# Patient Record
Sex: Male | Born: 1990 | Race: White | Hispanic: No | Marital: Single
Health system: Southern US, Community
[De-identification: ages and names within clinical notes are randomized; demographics above are authoritative.]

---

## 2019-04-22 ENCOUNTER — Inpatient Hospital Stay (HOSPITAL_COMMUNITY): Payer: Medicare Other

## 2019-04-22 ENCOUNTER — Inpatient Hospital Stay (HOSPITAL_COMMUNITY)
Admission: AD | Admit: 2019-04-22 | Discharge: 2019-04-30 | DRG: 871 | Disposition: A | Discharge status: Home / Self Care | Payer: Medicare Other | Source: Other Acute Inpatient Hospital | Attending: Internal Medicine | Admitting: Internal Medicine

## 2019-04-22 DIAGNOSIS — A419 Sepsis, unspecified organism: Secondary | ICD-10-CM

## 2019-04-22 DIAGNOSIS — J690 Pneumonitis due to inhalation of food and vomit: Secondary | ICD-10-CM | POA: Diagnosis present

## 2019-04-22 DIAGNOSIS — J1282 Pneumonia due to coronavirus disease 2019: Secondary | ICD-10-CM | POA: Diagnosis present

## 2019-04-22 DIAGNOSIS — R748 Abnormal levels of other serum enzymes: Secondary | ICD-10-CM | POA: Diagnosis present

## 2019-04-22 DIAGNOSIS — D696 Thrombocytopenia, unspecified: Secondary | ICD-10-CM | POA: Diagnosis present

## 2019-04-22 DIAGNOSIS — R6250 Unspecified lack of expected normal physiological development in childhood: Secondary | ICD-10-CM | POA: Diagnosis present

## 2019-04-22 DIAGNOSIS — G800 Spastic quadriplegic cerebral palsy: Secondary | ICD-10-CM | POA: Diagnosis present

## 2019-04-22 DIAGNOSIS — U071 COVID-19: Secondary | ICD-10-CM | POA: Diagnosis present

## 2019-04-22 DIAGNOSIS — E872 Acidosis: Secondary | ICD-10-CM | POA: Diagnosis present

## 2019-04-22 DIAGNOSIS — A4189 Other specified sepsis: Principal | ICD-10-CM | POA: Diagnosis present

## 2019-04-22 DIAGNOSIS — K7200 Acute and subacute hepatic failure without coma: Secondary | ICD-10-CM | POA: Diagnosis present

## 2019-04-22 DIAGNOSIS — G919 Hydrocephalus, unspecified: Secondary | ICD-10-CM | POA: Diagnosis present

## 2019-04-22 DIAGNOSIS — Q046 Congenital cerebral cysts: Secondary | ICD-10-CM | POA: Diagnosis not present

## 2019-04-22 DIAGNOSIS — R6521 Severe sepsis with septic shock: Secondary | ICD-10-CM | POA: Diagnosis present

## 2019-04-22 DIAGNOSIS — J9601 Acute respiratory failure with hypoxia: Secondary | ICD-10-CM | POA: Diagnosis present

## 2019-04-22 DIAGNOSIS — D649 Anemia, unspecified: Secondary | ICD-10-CM | POA: Diagnosis present

## 2019-04-22 DIAGNOSIS — H547 Unspecified visual loss: Secondary | ICD-10-CM | POA: Diagnosis present

## 2019-04-22 DIAGNOSIS — E230 Hypopituitarism: Secondary | ICD-10-CM | POA: Diagnosis present

## 2019-04-22 DIAGNOSIS — Z982 Presence of cerebrospinal fluid drainage device: Secondary | ICD-10-CM

## 2019-04-22 DIAGNOSIS — Z79899 Other long term (current) drug therapy: Secondary | ICD-10-CM

## 2019-04-22 DIAGNOSIS — E861 Hypovolemia: Secondary | ICD-10-CM | POA: Diagnosis present

## 2019-04-22 DIAGNOSIS — G40909 Epilepsy, unspecified, not intractable, without status epilepticus: Secondary | ICD-10-CM | POA: Diagnosis present

## 2019-04-22 DIAGNOSIS — E871 Hypo-osmolality and hyponatremia: Secondary | ICD-10-CM | POA: Diagnosis not present

## 2019-04-22 DIAGNOSIS — J9690 Respiratory failure, unspecified, unspecified whether with hypoxia or hypercapnia: Secondary | ICD-10-CM

## 2019-04-22 LAB — POCT I-STAT 7, (LYTES, BLD GAS, ICA,H+H)
Acid-base deficit: 12 mmol/L — ABNORMAL HIGH (ref 0.0–2.0)
Bicarbonate: 13.1 mmol/L — ABNORMAL LOW (ref 20.0–28.0)
Calcium, Ion: 1.11 mmol/L — ABNORMAL LOW (ref 1.15–1.40)
HCT: 34 % — ABNORMAL LOW (ref 39.0–52.0)
Hemoglobin: 11.6 g/dL — ABNORMAL LOW (ref 13.0–17.0)
O2 Saturation: 81 %
Patient temperature: 99.9
Potassium: 3.8 mmol/L (ref 3.5–5.1)
Sodium: 144 mmol/L (ref 135–145)
TCO2: 14 mmol/L — ABNORMAL LOW (ref 22–32)
pCO2 arterial: 27.1 mmHg — ABNORMAL LOW (ref 32.0–48.0)
pH, Arterial: 7.295 — ABNORMAL LOW (ref 7.350–7.450)
pO2, Arterial: 51 mmHg — ABNORMAL LOW (ref 83.0–108.0)

## 2019-04-22 LAB — GLUCOSE, CAPILLARY
Glucose-Capillary: 125 mg/dL — ABNORMAL HIGH (ref 70–99)
Glucose-Capillary: 111 mg/dL — ABNORMAL HIGH (ref 70–99)
Glucose-Capillary: 128 mg/dL — ABNORMAL HIGH (ref 70–99)

## 2019-04-22 LAB — COMPREHENSIVE METABOLIC PANEL
ALT: 246 U/L — ABNORMAL HIGH (ref 0–44)
AST: 379 U/L — ABNORMAL HIGH (ref 15–41)
Albumin: 3.2 g/dL — ABNORMAL LOW (ref 3.5–5.0)
Alkaline Phosphatase: 139 U/L — ABNORMAL HIGH (ref 38–126)
Anion gap: 16 — ABNORMAL HIGH (ref 5–15)
BUN: 14 mg/dL (ref 6–20)
CO2: 13 mmol/L — ABNORMAL LOW (ref 22–32)
Calcium: 8.1 mg/dL — ABNORMAL LOW (ref 8.9–10.3)
Chloride: 111 mmol/L (ref 98–111)
Creatinine, Ser: 0.97 mg/dL (ref 0.61–1.24)
GFR calc Af Amer: >60 mL/min (ref >60)
GFR calc non Af Amer: >60 mL/min (ref >60)
Glucose, Bld: 126 mg/dL — ABNORMAL HIGH (ref 70–99)
Potassium: 3.4 mmol/L — ABNORMAL LOW (ref 3.5–5.1)
Sodium: 140 mmol/L (ref 135–145)
Total Bilirubin: 1.3 mg/dL — ABNORMAL HIGH (ref 0.3–1.2)
Total Protein: 6.4 g/dL — ABNORMAL LOW (ref 6.5–8.1)

## 2019-04-22 LAB — D-DIMER, QUANTITATIVE: D-Dimer, Quant: >20 ug/mL-FEU — ABNORMAL HIGH (ref 0.00–0.50)

## 2019-04-22 LAB — MAGNESIUM: Magnesium: 1.3 mg/dL — ABNORMAL LOW (ref 1.7–2.4)

## 2019-04-22 LAB — PHOSPHORUS: Phosphorus: 3.3 mg/dL (ref 2.5–4.6)

## 2019-04-22 LAB — PROCALCITONIN: Procalcitonin: >150 ng/mL

## 2019-04-22 LAB — C-REACTIVE PROTEIN: CRP: 20.5 mg/dL — ABNORMAL HIGH (ref <1.0)

## 2019-04-22 MED ORDER — LACTATED RINGERS IV SOLN: INTRAVENOUS | Status: DC

## 2019-04-22 MED ORDER — SODIUM CHLORIDE 0.9 % IV SOLN: 100.0000 mg | Freq: Every day | INTRAVENOUS | Status: DC

## 2019-04-22 MED ORDER — SODIUM CHLORIDE 0.9 % IV SOLN
200.0000 mg | Freq: Once | INTRAVENOUS | Status: DC
  Filled 2019-04-22: qty 40

## 2019-04-22 MED ORDER — LACTATED RINGERS IV BOLUS
500.0000 mL | Freq: Once | INTRAVENOUS | Status: AC
  Administered 2019-04-22: 500 mL via INTRAVENOUS

## 2019-04-22 MED ORDER — VASOPRESSIN 20 UNIT/ML IV SOLN
0.0300 [IU]/min | INTRAVENOUS | Status: DC
  Administered 2019-04-22 – 2019-04-23 (×2): 0.03 [IU]/min via INTRAVENOUS
  Filled 2019-04-22 (×2): qty 2

## 2019-04-22 MED ORDER — SODIUM CHLORIDE 0.9 % IV SOLN
100.0000 mg | Freq: Every day | INTRAVENOUS | Status: AC
  Administered 2019-04-23 – 2019-04-26 (×4): 100 mg via INTRAVENOUS
  Filled 2019-04-22 (×4): qty 20

## 2019-04-22 MED ORDER — ENOXAPARIN SODIUM 40 MG/0.4ML ~~LOC~~ SOLN
40.0000 mg | SUBCUTANEOUS | Status: DC
  Administered 2019-04-22: 40 mg via SUBCUTANEOUS
  Filled 2019-04-22: qty 0.4

## 2019-04-22 MED ORDER — INSULIN ASPART 100 UNIT/ML ~~LOC~~ SOLN
0.0000 [IU] | SUBCUTANEOUS | Status: DC
  Administered 2019-04-23: 20:00:00 2 [IU] via SUBCUTANEOUS
  Administered 2019-04-24: 08:00:00 1 [IU] via SUBCUTANEOUS
  Administered 2019-04-24 (×2): 2 [IU] via SUBCUTANEOUS
  Administered 2019-04-25 – 2019-04-29 (×5): 1 [IU] via SUBCUTANEOUS

## 2019-04-22 MED ORDER — CHLORHEXIDINE GLUCONATE 0.12 % MT SOLN
15.0000 mL | Freq: Two times a day (BID) | OROMUCOSAL | Status: DC
  Administered 2019-04-22 – 2019-04-30 (×16): 15 mL via OROMUCOSAL
  Filled 2019-04-22 (×13): qty 15

## 2019-04-22 MED ORDER — LEVETIRACETAM IN NACL 500 MG/100ML IV SOLN
500.0000 mg | Freq: Two times a day (BID) | INTRAVENOUS | Status: DC
  Administered 2019-04-22 – 2019-04-23 (×2): 500 mg via INTRAVENOUS
  Filled 2019-04-22 (×3): qty 100

## 2019-04-22 MED ORDER — NOREPINEPHRINE 4 MG/250ML-% IV SOLN
0.0000 ug/min | INTRAVENOUS | Status: DC
  Filled 2019-04-22: qty 250

## 2019-04-22 MED ORDER — PIPERACILLIN-TAZOBACTAM 3.375 G IVPB
3.3750 g | Freq: Three times a day (TID) | INTRAVENOUS | Status: AC
  Administered 2019-04-22 – 2019-04-29 (×21): 3.375 g via INTRAVENOUS
  Filled 2019-04-22 (×22): qty 50

## 2019-04-22 MED ORDER — ORAL CARE MOUTH RINSE
15.0000 mL | Freq: Two times a day (BID) | OROMUCOSAL | Status: DC
  Administered 2019-04-22 – 2019-04-30 (×15): 15 mL via OROMUCOSAL

## 2019-04-22 MED ORDER — CHLORHEXIDINE GLUCONATE CLOTH 2 % EX PADS
6.0000 | MEDICATED_PAD | Freq: Every day | CUTANEOUS | Status: DC
  Administered 2019-04-23 – 2019-04-30 (×8): 6 via TOPICAL

## 2019-04-22 MED ORDER — DEXAMETHASONE SODIUM PHOSPHATE 10 MG/ML IJ SOLN
6.0000 mg | INTRAMUSCULAR | Status: DC
  Administered 2019-04-22 – 2019-04-25 (×4): 6 mg via INTRAVENOUS
  Filled 2019-04-22 (×4): qty 1

## 2019-04-22 MED ORDER — SODIUM CHLORIDE 0.9 % IV SOLN: INTRAVENOUS | Status: DC | PRN

## 2019-04-22 MED ORDER — NOREPINEPHRINE 16 MG/250ML-% IV SOLN
0.0000 ug/min | INTRAVENOUS | Status: DC
  Administered 2019-04-22 (×2): 70 ug/min via INTRAVENOUS
  Administered 2019-04-23: 12:00:00 45 ug/min via INTRAVENOUS
  Administered 2019-04-23: 18:00:00 47.5 ug/min via INTRAVENOUS
  Administered 2019-04-23 (×2): 70 ug/min via INTRAVENOUS
  Administered 2019-04-24: 10 ug/min via INTRAVENOUS
  Administered 2019-04-24: 01:00:00 30 ug/min via INTRAVENOUS
  Filled 2019-04-22 (×8): qty 250

## 2019-04-22 MED ORDER — ACETAMINOPHEN 650 MG RE SUPP
650.0000 mg | RECTAL | Status: DC | PRN
  Administered 2019-04-22 – 2019-04-25 (×2): 650 mg via RECTAL
  Filled 2019-04-22 (×2): qty 1

## 2019-04-22 NOTE — Progress Notes (Signed)
Received pt from [facility] via [place] on NRB. Placed pt on HFNC at 15L, then added NRB after approximately 15 minutes. Left Radial a-line placed 7.29/27/51/13. Pt placed on Heated HFNC at 40L/100%. Pt with nasal flaring and increased WOB upon admission but has gotten somewhat better with H HFNC and NRB. MD aware.

## 2019-04-22 NOTE — Procedures (Signed)
Arterial Catheter Insertion Procedure Note Owin Vignola [id number] 09-21-1990  Procedure: Insertion of Arterial Catheter  Indications: Blood pressure monitoring and Frequent blood sampling  Procedure Details Consent: Risks of procedure as well as the alternatives and risks of each were explained to the (patient/caregiver).  Consent for procedure obtained. Time Out: Verified patient identification, verified procedure, site/side was marked, verified correct patient position, special equipment/implants available, medications/allergies/relevent history reviewed, required imaging and test results available.  Performed  Maximum sterile technique was used including cap, gloves, gown, hand hygiene and mask. Skin prep: Chlorhexidine; local anesthetic administered 20 gauge catheter was inserted into left radial artery using the Seldinger technique. ULTRASOUND GUIDANCE USED: NO Evaluation Blood flow good; BP tracing good. Complications: No apparent complications.   Carolan Shiver 04/22/2019

## 2019-04-22 NOTE — Plan of Care (Signed)

## 2019-04-22 NOTE — Plan of Care (Signed)
Patient remains on a lot of oxygen 40L @100 % via HFNC. Tolerating well.  Tachycardic in the 130's and tachypnic in the 30's - 50's.  BP maintaining with Levophed and Vasopressin.  If Levophed line occludes even for a couple of seconds, BP immediately drops to 70's, but rebounds once levo restarted.  Pt has been moving about in bed, moving all extremities.  Does not appear to be in any distress. Nursing remains at bedside.  Will continue to monitor.   Problem: Education: Goal: Knowledge of General Education information will improve Description: Including pain rating scale, medication(s)/side effects and non-pharmacologic comfort measures Outcome: Progressing   Problem: Health Behavior/Discharge Planning: Goal: Ability to manage health-related needs will improve Outcome: Progressing   Problem: Clinical Measurements: Goal: Will remain free from infection Outcome: Progressing Goal: Diagnostic test results will improve Outcome: Progressing   Problem: Activity: Goal: Risk for activity intolerance will decrease Outcome: Progressing   Problem: Nutrition: Goal: Adequate nutrition will be maintained Outcome: Progressing   Problem: Coping: Goal: Level of anxiety will decrease Outcome: Progressing   Problem: Elimination: Goal: Will not experience complications related to bowel motility Outcome: Progressing Goal: Will not experience complications related to urinary retention Outcome: Progressing   Problem: Pain Managment: Goal: General experience of comfort will improve Outcome: Progressing   Problem: Safety: Goal: Ability to remain free from injury will improve Outcome: Progressing   Problem: Skin Integrity: Goal: Risk for impaired skin integrity will decrease Outcome: Progressing   Problem: Clinical Measurements: Goal: Ability to maintain clinical measurements within normal limits will improve Outcome: Not Progressing Goal: Respiratory complications will improve Outcome: Not  Progressing Goal: Cardiovascular complication will be avoided Outcome: Not Progressing

## 2019-04-22 NOTE — H&P (Signed)
NAME:  Craig Foster, MRN:  [id number], DOB:  1990/04/06, LOS: 0 ADMISSION DATE:  04/22/2019, CONSULTATION DATE:  04/22/2019 REFERRING MD:  Dr. Elvera Lennox Triad, CHIEF COMPLAINT:  Short of breath   Brief History   29 yo with hx of CP, seizure disorder, developmental delay brought to [facility] ER with dyspnea, fever.  Tested positive for COVID 19.  Had seizure, vomiting, and then aspiration.  Transferred to [facility] for further management.  History of present illness   Spoke with pt's father over video conference.  He needs 24 hour care.  He needs assistance with eating.  He can sometimes say a few words.  Most of communication if from his father's understanding of his body language.  Past Medical History  Blind, cerebral palsy, seizure disorder, developmental delay, hypopituitarism  Significant Hospital Events   1/20 transfer from [facility]:    Procedures:    Significant Diagnostic Tests:    Micro Data:  SARS CoV2 1/20 [name] >> positive  COVID Therapy:  Decadron 1/20 >> Remdesivir 1/20 >>  Antimicrobials:  Zosyn 1/20 >>    Interim history/subjective:    Objective   There were no vitals taken for this visit.       No intake or output data in the 24 hours ending 04/22/19 1734 There were no vitals filed for this visit.  Examination:  General - non verbal, using some accessory muscles Eyes - pupils dilated and unequal ENT - no sinus tenderness, no stridor Cardiac - regular, tachycardic Chest - b/l crackles Abdomen - soft, non tender, + bowel sounds Extremities - contracted Skin - no rashes Neuro - able to move Rt arm, Lt arm weak   Resolved Hospital Problem list     Assessment & Plan:   Acute hypoxic respiratory failure from COVID 19 pneumonia and aspiration pneumonia. - oxygen to keep SpO2 85 to 95% - start decadron, remdesivir - zosyn per pharmacy - f/u CXR, ABG - d/w pt's father >> agreeable to intubation if needed  Septic shock from aspiration  pneumonia and COVID 19 pneumonia. - pressors to keep MAP > 65 - continue IV fluids  Seizure disorder, cerebral palsy, blindness. - keppra 500 mg bid - prn ativan - communication will be difficult  Metabolic acidosis with lactic acidosis. - f/u BMET and then decide if he needs HCO3 in IV fluid  Hx of hypopituitarism. - decadron in place of cortef for now - add synthroid once pharmacy has reviewed his outpt medications  Vomiting with Gastric distention. - have NG tube placed to suction - f/u abd xray  Best practice:  Diet: NPO DVT prophylaxis: lovenox GI prophylaxis: Not indicated Mobility: Bed rest Code Status: full code Family Communication: spoke with father by video conference Disposition: ICU  Labs   CBC: Recent Labs  Lab 04/22/19 1719  HGB 11.6*  HCT 34.0*    Basic Metabolic Panel: Recent Labs  Lab 04/22/19 1719  NA 144  K 3.8   GFR: CrCl cannot be calculated (No successful lab value found.). No results for input(s): PROCALCITON, WBC, LATICACIDVEN in the last 168 hours.  Liver Function Tests: No results for input(s): AST, ALT, ALKPHOS, BILITOT, PROT, ALBUMIN in the last 168 hours. No results for input(s): LIPASE, AMYLASE in the last 168 hours. No results for input(s): AMMONIA in the last 168 hours.  ABG    Component Value Date/Time   PHART 7.295 (L) 04/22/2019 1719   PCO2ART 27.1 (L) 04/22/2019 1719   PO2ART 51.0 (L) 04/22/2019 1719  HCO3 13.1 (L) 04/22/2019 1719   TCO2 14 (L) 04/22/2019 1719   ACIDBASEDEF 12.0 (H) 04/22/2019 1719   O2SAT 81.0 04/22/2019 1719     Coagulation Profile: No results for input(s): INR, PROTIME in the last 168 hours.  Cardiac Enzymes: No results for input(s): CKTOTAL, CKMB, CKMBINDEX, TROPONINI in the last 168 hours.  HbA1C: No results found for: HGBA1C  CBG: No results for input(s): GLUCAP in the last 168 hours.  Review of Systems:   Unable to obtain.  Surgical History   Unable to obtain.  Social  History   Lives with his father  Family History   Unable to obtain.  Allergies No Known Allergies   Home Medications  Prior to Admission medications   Not on File     Critical care time: 39 minutes    Chesley Mires, MD [place] 04/22/2019, 5:46 PM

## 2019-04-22 NOTE — Progress Notes (Signed)
Pharmacy Antibiotic Note/Lovenox  Craig Foster is a 29 y.o. male with a h/o CP and seizure disorder admitted on 04/22/2019 with acute respiratory failure from COVID-19 PNA and aspiraton PNA.  Pharmacy has been consulted for Zosyn dosing.  SCr 1.4 (RH) Ht/Wt from [facility] 60" 57.6 kg No d-dimer  Plan: -Zosyn 4.5 g given this AM at [facility] -Zosyn 3.375 g EI q 8 h -F/U renal function, culture results  -Lovenox 40 mg daily -F/U renal function, d-dimer     No data recorded.  No results for input(s): WBC, CREATININE, LATICACIDVEN, VANCOTROUGH, VANCOPEAK, VANCORANDOM, GENTTROUGH, GENTPEAK, GENTRANDOM, TOBRATROUGH, TOBRAPEAK, TOBRARND, AMIKACINPEAK, AMIKACINTROU, AMIKACIN in the last 168 hours.  CrCl cannot be calculated (No successful lab value found.).    No Known Allergies  Antimicrobials this admission: 1/20 Zosyn >>   Microbiology results: MRSA PCR:   Thank you for allowing pharmacy to be a part of this patient's care.  Valentina Gu 04/22/2019 6:27 PM

## 2019-04-22 NOTE — Progress Notes (Signed)
Assisted tele visit to patient with father and provider.  Vena Austria, RN

## 2019-04-23 ENCOUNTER — Inpatient Hospital Stay (HOSPITAL_COMMUNITY): Payer: Medicare Other

## 2019-04-23 LAB — CBC WITH DIFFERENTIAL/PLATELET
Abs Immature Granulocytes: 1.6 10*3/uL — ABNORMAL HIGH (ref 0.00–0.07)
Band Neutrophils: 43 %
Basophils Absolute: 0 10*3/uL (ref 0.0–0.1)
Basophils Relative: 0 %
Eosinophils Absolute: 0 10*3/uL (ref 0.0–0.5)
Eosinophils Relative: 0 %
HCT: 28.5 % — ABNORMAL LOW (ref 39.0–52.0)
Hemoglobin: 9.9 g/dL — ABNORMAL LOW (ref 13.0–17.0)
Lymphocytes Relative: 4 %
Lymphs Abs: 1.3 10*3/uL (ref 0.7–4.0)
MCH: 29.9 pg (ref 26.0–34.0)
MCHC: 34.7 g/dL (ref 30.0–36.0)
MCV: 86.1 fL (ref 80.0–100.0)
Metamyelocytes Relative: 2 %
Monocytes Absolute: 1 10*3/uL (ref 0.1–1.0)
Monocytes Relative: 3 %
Myelocytes: 3 %
Neutro Abs: 28 10*3/uL — ABNORMAL HIGH (ref 1.7–7.7)
Neutrophils Relative %: 45 %
Platelets: 212 10*3/uL (ref 150–400)
RBC: 3.31 MIL/uL — ABNORMAL LOW (ref 4.22–5.81)
RDW: 12.3 % (ref 11.5–15.5)
WBC Morphology: INCREASED
WBC: 31.8 10*3/uL — ABNORMAL HIGH (ref 4.0–10.5)
nRBC: 0 % (ref 0.0–0.2)

## 2019-04-23 LAB — BASIC METABOLIC PANEL
Anion gap: 10 (ref 5–15)
BUN: 9 mg/dL (ref 6–20)
CO2: 16 mmol/L — ABNORMAL LOW (ref 22–32)
Calcium: 7.5 mg/dL — ABNORMAL LOW (ref 8.9–10.3)
Chloride: 105 mmol/L (ref 98–111)
Creatinine, Ser: 0.57 mg/dL — ABNORMAL LOW (ref 0.61–1.24)
GFR calc Af Amer: >60 mL/min (ref >60)
GFR calc non Af Amer: >60 mL/min (ref >60)
Glucose, Bld: 130 mg/dL — ABNORMAL HIGH (ref 70–99)
Potassium: 4.6 mmol/L (ref 3.5–5.1)
Sodium: 131 mmol/L — ABNORMAL LOW (ref 135–145)

## 2019-04-23 LAB — MRSA PCR SCREENING: MRSA by PCR: POSITIVE — AB

## 2019-04-23 LAB — COMPREHENSIVE METABOLIC PANEL
ALT: 215 U/L — ABNORMAL HIGH (ref 0–44)
AST: 239 U/L — ABNORMAL HIGH (ref 15–41)
Albumin: 3 g/dL — ABNORMAL LOW (ref 3.5–5.0)
Alkaline Phosphatase: 116 U/L (ref 38–126)
Anion gap: 16 — ABNORMAL HIGH (ref 5–15)
BUN: 12 mg/dL (ref 6–20)
CO2: 15 mmol/L — ABNORMAL LOW (ref 22–32)
Calcium: 7.8 mg/dL — ABNORMAL LOW (ref 8.9–10.3)
Chloride: 109 mmol/L (ref 98–111)
Creatinine, Ser: 0.79 mg/dL (ref 0.61–1.24)
GFR calc Af Amer: >60 mL/min (ref >60)
GFR calc non Af Amer: >60 mL/min (ref >60)
Glucose, Bld: 95 mg/dL (ref 70–99)
Potassium: 3.7 mmol/L (ref 3.5–5.1)
Sodium: 140 mmol/L (ref 135–145)
Total Bilirubin: 0.9 mg/dL (ref 0.3–1.2)
Total Protein: 5.9 g/dL — ABNORMAL LOW (ref 6.5–8.1)

## 2019-04-23 LAB — GLUCOSE, CAPILLARY
Glucose-Capillary: 96 mg/dL (ref 70–99)
Glucose-Capillary: 65 mg/dL — ABNORMAL LOW (ref 70–99)
Glucose-Capillary: 141 mg/dL — ABNORMAL HIGH (ref 70–99)
Glucose-Capillary: 76 mg/dL (ref 70–99)
Glucose-Capillary: 114 mg/dL — ABNORMAL HIGH (ref 70–99)
Glucose-Capillary: 173 mg/dL — ABNORMAL HIGH (ref 70–99)

## 2019-04-23 LAB — PHOSPHORUS: Phosphorus: 2.5 mg/dL (ref 2.5–4.6)

## 2019-04-23 LAB — HEMOGLOBIN A1C
Hgb A1c MFr Bld: 5.3 % (ref 4.8–5.6)
Mean Plasma Glucose: 105.41 mg/dL

## 2019-04-23 LAB — C-REACTIVE PROTEIN: CRP: 22.1 mg/dL — ABNORMAL HIGH (ref <1.0)

## 2019-04-23 LAB — LACTIC ACID, PLASMA
Lactic Acid, Venous: 5.3 mmol/L (ref 0.5–1.9)
Lactic Acid, Venous: 1.7 mmol/L (ref 0.5–1.9)

## 2019-04-23 LAB — MAGNESIUM: Magnesium: 1.3 mg/dL — ABNORMAL LOW (ref 1.7–2.4)

## 2019-04-23 LAB — HIV ANTIBODY (ROUTINE TESTING W REFLEX): HIV Screen 4th Generation wRfx: NONREACTIVE

## 2019-04-23 LAB — D-DIMER, QUANTITATIVE: D-Dimer, Quant: >20 ug/mL-FEU — ABNORMAL HIGH (ref 0.00–0.50)

## 2019-04-23 MED ORDER — ENOXAPARIN SODIUM 40 MG/0.4ML ~~LOC~~ SOLN
40.0000 mg | Freq: Two times a day (BID) | SUBCUTANEOUS | Status: DC
  Administered 2019-04-23 – 2019-04-26 (×7): 40 mg via SUBCUTANEOUS
  Filled 2019-04-23 (×7): qty 0.4

## 2019-04-23 MED ORDER — MAGNESIUM SULFATE 4 GM/100ML IV SOLN
4.0000 g | Freq: Once | INTRAVENOUS | Status: AC
  Administered 2019-04-23: 4 g via INTRAVENOUS
  Filled 2019-04-23: qty 100

## 2019-04-23 MED ORDER — VANCOMYCIN HCL 1250 MG/250ML IV SOLN
1250.0000 mg | Freq: Once | INTRAVENOUS | Status: AC
  Administered 2019-04-23: 09:00:00 1250 mg via INTRAVENOUS
  Filled 2019-04-23: qty 250

## 2019-04-23 MED ORDER — DEXTROSE 50 % IV SOLN: 25.0000 mL | Freq: Once | INTRAVENOUS | Status: AC

## 2019-04-23 MED ORDER — DEXTROSE IN LACTATED RINGERS 5 % IV SOLN: INTRAVENOUS | Status: DC

## 2019-04-23 MED ORDER — POTASSIUM CHLORIDE 10 MEQ/100ML IV SOLN
10.0000 meq | INTRAVENOUS | Status: AC
  Administered 2019-04-23 (×4): 10 meq via INTRAVENOUS
  Filled 2019-04-23 (×2): qty 100

## 2019-04-23 MED ORDER — DEXTROSE 50 % IV SOLN
INTRAVENOUS | Status: AC
  Administered 2019-04-23: 25 mL via INTRAVENOUS
  Filled 2019-04-23: qty 50

## 2019-04-23 MED ORDER — VANCOMYCIN HCL IN DEXTROSE 1-5 GM/200ML-% IV SOLN
1000.0000 mg | Freq: Three times a day (TID) | INTRAVENOUS | Status: DC
  Administered 2019-04-23 – 2019-04-24 (×2): 1000 mg via INTRAVENOUS
  Filled 2019-04-23 (×4): qty 200

## 2019-04-23 MED ORDER — LEVETIRACETAM IN NACL 1500 MG/100ML IV SOLN
1500.0000 mg | Freq: Two times a day (BID) | INTRAVENOUS | Status: DC
  Administered 2019-04-23 – 2019-04-26 (×6): 1500 mg via INTRAVENOUS
  Filled 2019-04-23 (×7): qty 100

## 2019-04-23 MED ORDER — LEVOTHYROXINE SODIUM 100 MCG/5ML IV SOLN
87.5000 ug | Freq: Every day | INTRAVENOUS | Status: DC
  Administered 2019-04-23 – 2019-04-26 (×4): 87.5 ug via INTRAVENOUS
  Filled 2019-04-23 (×4): qty 5

## 2019-04-23 MED ORDER — MUPIROCIN 2 % EX OINT
TOPICAL_OINTMENT | Freq: Two times a day (BID) | CUTANEOUS | Status: DC
  Administered 2019-04-29: 1 via NASAL
  Filled 2019-04-23 (×2): qty 22

## 2019-04-23 MED ORDER — VANCOMYCIN HCL IN DEXTROSE 1-5 GM/200ML-% IV SOLN
1000.0000 mg | Freq: Three times a day (TID) | INTRAVENOUS | Status: DC
  Filled 2019-04-23 (×2): qty 200

## 2019-04-23 NOTE — Progress Notes (Signed)
Pharmacy Antibiotic Note/Lovenox  Craig Foster is a 29 y.o. male with a h/o CP and seizure disorder admitted on 04/22/2019 with acute respiratory failure from COVID-19 PNA and aspiraton PNA.  Currently on IV Zosyn. Now broadening coverage with vancomycin.   SCr has trended down. LA 5.3, PCT 22, WBC 31  Plan: -Zosyn 3.375 g EI q 8 h -Vancomycin 1250 mg IV load, followed by vancomycin 1 gm IV Q 8 hours per traditional dosing  -F/U renal function, culture results -VT at steady state   Weight: 138 lb 0.1 oz (62.6 kg)  Temp (24hrs), Avg:100.3 F (37.9 C), Min:99.9 F (37.7 C), Max:101 F (38.3 C)  Recent Labs  Lab 04/22/19 1730 04/23/19 0417  WBC  --  31.8*  CREATININE 0.97 0.79  LATICACIDVEN  --  5.3*    CrCl cannot be calculated (Unknown ideal weight.).    Allergies  Allergen Reactions  . Azithromycin Other (See Comments)    Contraindicated with his tegretol, causes tegretol toxicity.    . Erythromycin Other (See Comments)    Causes tegretol toxicity  . Risperidone And Related Nausea And Vomiting and Other (See Comments)    Other reaction(s): Nausea And Vomiting Severe pain   . Tape Rash    Antimicrobials this admission: 1/20 Zosyn >> 1/21 Vanc >>    Microbiology results: MRSA PCR:   Thank you for allowing pharmacy to be a part of this patient's care.  Vinnie Level, PharmD., BCPS Clinical Pharmacist Clinical phone for 04/23/19 until 5pm: [phone]

## 2019-04-23 NOTE — Progress Notes (Addendum)
0830: Spoke with patients father, Molly Maduro. Updated on patient status. All questions answered at this time. Wishing to set up elink meeting at 1400.  1050: Contacted Susan at [employer] to set up video chat with father Molly Maduro at 1400  1106: Spoke to mother, Marcelino Duster. Updated on patient status. All questions answered at this time. Wishing to do elink meeting at 1400 as well.  1115: Spoke to [place] at [employer]. Mother and father set up for call at 1400.  1407: Mother Marcelino Duster and father Molly Maduro on elink visit with Ivin Booty. Mother unable to hear [place] talk, but father Molly Maduro updated on patient status. Will call mother Marcelino Duster for update when call is complete. Plan to set up another elink visit for tomorrow at 1400.  1426: Mother Marcelino Duster called and updated on patient status.

## 2019-04-23 NOTE — Progress Notes (Signed)
NAME:  Craig Foster, MRN:  [id number], DOB:  October 11, 1990, LOS: 1 ADMISSION DATE:  04/22/2019, CONSULTATION DATE:  04/22/2019 REFERRING MD:  Dr. Cruzita Lederer Triad, CHIEF COMPLAINT:  Short of breath   Brief History   29 yo with hx of CP, seizure disorder, developmental delay brought to [facility] ER with dyspnea, fever.  Tested positive for COVID 19 on 1/21.  Had seizure, vomiting, and then aspiration.  Transferred to [facility] for further management.  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   Significant Hospital Events   1/20 transfer from [place]:    Procedures:    Significant Diagnostic Tests:    Micro Data:  SARS CoV2 1/20 [facility]) >> positive  COVID Therapy:  Decadron 1/20 >> Remdesivir 1/20 >>  Antimicrobials:  Zosyn 1/20 >>  vanc 1/20>>>  Interim history/subjective:  Remains on high dose pressors although we are coming down on them   Objective   Blood pressure (Abnormal) 105/92, pulse (Abnormal) 124, temperature 100 F (37.8 C), temperature source Axillary, resp. rate (Abnormal) 40, weight 62.6 kg, SpO2 94 %.    FiO2 (%):  [40 %-100 %] 40 %   Intake/Output Summary (Last 24 hours) at 04/23/2019 1016 Last data filed at 04/23/2019 0800 Gross per 24 hour  Intake 2655.5 ml  Output 1750 ml  Net 905.5 ml   Filed Weights   04/23/19 0434  Weight: 62.6 kg    Examination: General 29 year old white male. Remains on high flow oxygen 100%/25 lf. Does not appear to be in acute distress this am HENT NCAT no JVD MMM no JVD pulm scattered rhonchi. Equal chest rise. No accessory use on current level of support Card RRR w/ no audible MRG abd soft. Not seemingly tender + bowel sounds Ext no sig edema cap refill brisk warm Neuro non-verbal or communicative. Not follow  commands. UEs contracted. Can move the right. Both LEs contracted. Essentially in fetal position. Does cross legs (and changes sides) gu cnc yellow    Resolved Hospital Problem list     Assessment & Plan:   Acute hypoxic respiratory failure from COVID 19 pneumonia and aspiration pneumonia. pcxr personally reviewed: predom L>R airspace disease. Better aeration today. Enlarged gastric bubble  Plan Cont supplemental oxygen Cont pulse ox Goal sats 85-95% Zosyn and vanc day 2 or 5 Day 2 Remdesivir  Day 2 systemic steroids Am cxr  Not candidate for prone position  Trend inflammatory markers  Septic shock from aspiration pneumonia and COVID 19 pneumonia. Has left fem CVL Pressor requirements improving  Plan Cont MIVFs Titrate norepi for MAP > 65 Once norepi needs are less than 10 mics/min will dc vasopressin Cont systemic steroids.  abx as above  Trend cbc  Fluid and electrolyte imbalance: hypomagnesemia  Plan Replace Mg  Elevated LFTs-->improveded Plan Cont to trend daily while on antiviral   Seizure disorder, cerebral palsy, blindness. Plan Cont keppra 500bid PRN ativan supportive care  Metabolic acidosis with lactic acidosis. Last LA 5.3 as of 4am and still has anion gap metabolic acidosis.  Plan Repeat LA and chemistry  Cont end-organ support  Hx of hypopituitarism. Plan Cont decadron in place of cortef for now Cont synthroid  Vomiting with Gastric distention. Plan Bowel rest for today Try to place NGT  Best practice:  Diet: NPO DVT prophylaxis: lovenox  GI prophylaxis: Not indicated Mobility: Bed rest Code Status: full code Family Communication: spoke with father by video conference Disposition: ICU    Critical care time: 61   Simonne Martinet ACNP-BC [facility] Pulmonary/Critical Care Pager # [phone] OR # [phone] if no answer

## 2019-04-23 NOTE — Progress Notes (Signed)
Assisted tele visit to patient with family member.  Craig Foster R, RN  

## 2019-04-23 NOTE — Progress Notes (Signed)
 Initial Nutrition Assessment  DOCUMENTATION CODES:   Not applicable  INTERVENTION:   Request height to better assess nutritional status and needs  Once diet advanced, recommend Ensure Enlive po TID, each supplement provides 350 kcal and 20 grams of protein and/or Magic cup TID with meals, each supplement provides 290 kcal and 9 grams of protein  If unable to advance diet recommend initiation of TF via NG tube/Cortrak:   Tube Feeding:  Vital AF 1.2 at 55 ml/hr Providing 99 g of protein, 1584 kcals and 1069 mL of free water   NUTRITION DIAGNOSIS:   Inadequate oral intake related to acute illness, catabolic illness as evidenced by NPO status.  GOAL:   Patient will meet greater than or equal to 90% of their needs  MONITOR:   Labs, Weight trends, Diet advancement  REASON FOR ASSESSMENT:   Consult, Ventilator Enteral/tube feeding initiation and management  ASSESSMENT:   29 yo male admitted with seizures, aspiration post vomiting, COVID+.  PMH includes CP, seizure disorder, developmental delay, blind. Pt is mostly non-verbal at baseline, uses hand gestures to communicate   RD working remotely.  1/20 Transfer from [place] to [facility]  Currently on HFNC, NPO  LE contracted; no height in system  Pt with vomiting PTA with gastric distention. Noted possible insertion of NG tube. Bowel rest per MD note  Unable to obtain diet and weight history at this time. No previous weight encounters.   No skin breakdown per RN skin assessment  Labs: CBG 65-141, magnesium 1.3 (L) Meds: D5-LR at 75 ml/hr, decadron, ss novolog   Diet Order:   Diet Order            Diet NPO time specified  Diet effective now              EDUCATION NEEDS:   Not appropriate for education at this time  Skin:  Skin Assessment: Reviewed RN Assessment  Last BM:  no documented BM  Height:   Ht Readings from Last 1 Encounters:  No data found for Ht    Weight:   Wt Readings from Last 1  Encounters:  04/23/19 62.6 kg    Ideal Body Weight:   unknown as no height  BMI:  There is no height or weight on file to calculate BMI.  Estimated Nutritional Needs:   Kcal:  6948-5462 kcals  Protein:  90-105 g  Fluid:  >/= 1.8 L    Ravin Bendall MS, RDN, LDN, CNSC [phone] Pager  [phone] Weekend/On-Call Pager

## 2019-04-24 ENCOUNTER — Inpatient Hospital Stay: Payer: Self-pay

## 2019-04-24 DIAGNOSIS — A419 Sepsis, unspecified organism: Secondary | ICD-10-CM

## 2019-04-24 DIAGNOSIS — R6521 Severe sepsis with septic shock: Secondary | ICD-10-CM

## 2019-04-24 DIAGNOSIS — E871 Hypo-osmolality and hyponatremia: Secondary | ICD-10-CM

## 2019-04-24 DIAGNOSIS — J690 Pneumonitis due to inhalation of food and vomit: Secondary | ICD-10-CM

## 2019-04-24 LAB — COMPREHENSIVE METABOLIC PANEL
ALT: 133 U/L — ABNORMAL HIGH (ref 0–44)
AST: 96 U/L — ABNORMAL HIGH (ref 15–41)
Albumin: 2.5 g/dL — ABNORMAL LOW (ref 3.5–5.0)
Alkaline Phosphatase: 87 U/L (ref 38–126)
Anion gap: 9 (ref 5–15)
BUN: 11 mg/dL (ref 6–20)
CO2: 17 mmol/L — ABNORMAL LOW (ref 22–32)
Calcium: 7.4 mg/dL — ABNORMAL LOW (ref 8.9–10.3)
Chloride: 98 mmol/L (ref 98–111)
Creatinine, Ser: 0.49 mg/dL — ABNORMAL LOW (ref 0.61–1.24)
GFR calc Af Amer: >60 mL/min (ref >60)
GFR calc non Af Amer: >60 mL/min (ref >60)
Glucose, Bld: 153 mg/dL — ABNORMAL HIGH (ref 70–99)
Potassium: 4.1 mmol/L (ref 3.5–5.1)
Sodium: 124 mmol/L — ABNORMAL LOW (ref 135–145)
Total Bilirubin: 0.8 mg/dL (ref 0.3–1.2)
Total Protein: 5.1 g/dL — ABNORMAL LOW (ref 6.5–8.1)

## 2019-04-24 LAB — BASIC METABOLIC PANEL
Anion gap: 9 (ref 5–15)
BUN: 12 mg/dL (ref 6–20)
CO2: 19 mmol/L — ABNORMAL LOW (ref 22–32)
Calcium: 7.6 mg/dL — ABNORMAL LOW (ref 8.9–10.3)
Chloride: 108 mmol/L (ref 98–111)
Creatinine, Ser: 0.49 mg/dL — ABNORMAL LOW (ref 0.61–1.24)
GFR calc Af Amer: >60 mL/min (ref >60)
GFR calc non Af Amer: >60 mL/min (ref >60)
Glucose, Bld: 101 mg/dL — ABNORMAL HIGH (ref 70–99)
Potassium: 3.9 mmol/L (ref 3.5–5.1)
Sodium: 136 mmol/L (ref 135–145)

## 2019-04-24 LAB — VANCOMYCIN, TROUGH: Vancomycin Tr: 18 ug/mL (ref 15–20)

## 2019-04-24 LAB — OSMOLALITY, URINE: Osmolality, Ur: 58 mOsm/kg — ABNORMAL LOW (ref 300–900)

## 2019-04-24 LAB — GLUCOSE, CAPILLARY
Glucose-Capillary: 103 mg/dL — ABNORMAL HIGH (ref 70–99)
Glucose-Capillary: 130 mg/dL — ABNORMAL HIGH (ref 70–99)
Glucose-Capillary: 134 mg/dL — ABNORMAL HIGH (ref 70–99)
Glucose-Capillary: 152 mg/dL — ABNORMAL HIGH (ref 70–99)
Glucose-Capillary: 158 mg/dL — ABNORMAL HIGH (ref 70–99)
Glucose-Capillary: 173 mg/dL — ABNORMAL HIGH (ref 70–99)
Glucose-Capillary: 89 mg/dL (ref 70–99)

## 2019-04-24 LAB — FERRITIN: Ferritin: 132 ng/mL (ref 24–336)

## 2019-04-24 LAB — SODIUM, URINE, RANDOM: Sodium, Ur: <10 mmol/L

## 2019-04-24 LAB — C-REACTIVE PROTEIN: CRP: 25.7 mg/dL — ABNORMAL HIGH (ref <1.0)

## 2019-04-24 LAB — D-DIMER, QUANTITATIVE: D-Dimer, Quant: 5.69 ug/mL-FEU — ABNORMAL HIGH (ref 0.00–0.50)

## 2019-04-24 MED ORDER — SODIUM CHLORIDE 0.9% FLUSH
10.0000 mL | Freq: Two times a day (BID) | INTRAVENOUS | Status: DC
  Administered 2019-04-24 – 2019-04-29 (×10): 10 mL
  Administered 2019-04-29: 19 mL
  Administered 2019-04-30: 10 mL

## 2019-04-24 MED ORDER — SODIUM CHLORIDE 0.9% FLUSH
10.0000 mL | INTRAVENOUS | Status: DC | PRN
  Administered 2019-04-24: 20 mL

## 2019-04-24 MED ORDER — ENSURE ENLIVE PO LIQD
237.0000 mL | Freq: Three times a day (TID) | ORAL | Status: DC
  Administered 2019-04-24: 75 mL via ORAL
  Administered 2019-04-24 – 2019-04-30 (×17): 237 mL via ORAL

## 2019-04-24 NOTE — Progress Notes (Addendum)
Per CCM rounding, plan to decrease D5LR to [facility], start patient on a diet, discontinue Vancomycin, check urine osmolality and repeat lytes this afternoon.   1007: Spoke with mother Craig Foster. Updated on patient condition. All questions answered at this time.  Per mother, Craig Foster likes oatmeal with butter, milk, and bananas or brown sugar. He will also eat mac n cheese, peanut butter, chicken, and [place]. He does not like ice cream, but will eat pudding. His food must be cut into small pieces. He likes his drinks room temperature. Does not like pop or juice but will drink milk, water and tea.  1050: Elink Mrs. Craig Foster notified patients father and mother would like to set up [place] visit for 1400.  1117: Attempted to give patient water. Patient refused.  [id number]: E link visit with mother and father. Father Craig Foster consented at this time for Craig Foster to receive a PICC line. Witnessed by Delorise Jackson RN. Mother unable to hear during call. Mother updated after elink visit complete.

## 2019-04-24 NOTE — Progress Notes (Deleted)
Per CCM rounding, plan to decrease D5LR to [facility], start patient on a diet, discontinue Vancomycin, check urine osmolality and repeat lytes this afternoon.   1007: Spoke with mother Craig Foster. Updated on patient condition. All questions answered at this time.  Per mother, Craig Foster likes oatmeal with butter, milk, and bananas or brown sugar. He will also eat mac n cheese, peanut butter, chicken, and [place]. He does not like ice cream, but will eat pudding. His food must be cut into small pieces. He likes his drinks room temperature. Does not like pop or juice but will drink milk, water and tea.

## 2019-04-24 NOTE — Progress Notes (Signed)
Mother updated via phone   Simonne Martinet ACNP-BC [facility] Pulmonary/Critical Care Pager # [phone] OR # [phone] if no answer

## 2019-04-24 NOTE — Progress Notes (Signed)
Spoke with mother and father separately on the phone and updated them about Craig Foster's progress and condition.  No further questions at this time.

## 2019-04-24 NOTE — Progress Notes (Signed)
Peripherally Inserted Central Catheter/Midline Placement  The IV Nurse has discussed with the patient and/or persons authorized to consent for the patient, the purpose of this procedure and the potential benefits and risks involved with this procedure.  The benefits include less needle sticks, lab draws from the catheter, and the patient may be discharged home with the catheter. Risks include, but not limited to, infection, bleeding, blood clot (thrombus formation), and puncture of an artery; nerve damage and irregular heartbeat and possibility to perform a PICC exchange if needed/ordered by physician.  Alternatives to this procedure were also discussed.  Bard Power PICC patient education guide, fact sheet on infection prevention and patient information card has been provided to patient /or left at bedside.    PICC/Midline Placement Documentation  PICC Double Lumen 04/24/19 PICC Right Basilic 32 cm 0 cm (Active)  Indication for Insertion or Continuance of Line Poor Vasculature-patient has had multiple peripheral attempts or PIVs lasting less than 24 hours 04/24/19 1740  Site Assessment Clean;Dry;Intact 04/24/19 1740  Lumen #1 Status Flushed;Blood return noted;Saline locked 04/24/19 1740  Lumen #2 Status Flushed;Blood return noted;Saline locked 04/24/19 1740  Dressing Type Transparent;Securing device 04/24/19 1740  Dressing Status Antimicrobial disc in place;Intact;Dry;Clean 04/24/19 1740  Dressing Change Due 05/01/19 04/24/19 1740       Romie Jumper 04/24/2019, 5:43 PM

## 2019-04-24 NOTE — Progress Notes (Signed)
NAME:  Craig Foster, MRN:  [id number], DOB:  12-02-1990, LOS: 2 ADMISSION DATE:  04/22/2019, CONSULTATION DATE:  04/22/2019 REFERRING MD:  Dr. Elvera Lennox [facility], CHIEF COMPLAINT:  Short of breath   Brief History   29 yo with hx of CP, seizure disorder, developmental delay brought to [facility] ER with dyspnea, fever.  Tested positive for COVID 19 on 1/21.  Had seizure, vomiting, and then aspiration.  Transferred to [facility] for further management.  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   Significant Hospital Events   1/20 transfer from [facility] 1/21 weaning pressors, lactic acid cleared  1/22 down to 6 liters. Vasopressin off. Still on low dose pressors. Shooting for SBP > 85. Na dropping. 140->131->124. Urine studies sent. KVO IVFs.  Consults:    Procedures:  Left fem CVL 1/20 at [place]    Significant Diagnostic Tests:    Micro Data:  SARS CoV2 1/20 [name] >> positive  COVID Therapy:  Decadron 1/20 >> Remdesivir 1/20 >>  Antimicrobials:  Zosyn 1/20 >>  vanc 1/20>>>1/22  Interim history/subjective:  Pressor requirements improved   Objective   Blood pressure 91/65, pulse 99, temperature 97.9 F (36.6 C), temperature source Axillary, resp. rate (Abnormal) 28, weight 62.4 kg, SpO2 100 %.    FiO2 (%):  [40 %] 40 %   Intake/Output Summary (Last 24 hours) at 04/24/2019 0843 Last data filed at 04/24/2019 0800 Gross per 24 hour  Intake 3995.32 ml  Output 3750 ml  Net 245.32 ml   Filed Weights   04/23/19 0434 04/24/19 0500  Weight: 62.6 kg 62.4 kg    Examination:  General 29 year old male. Lying in bed. Remains in fetal position but frequently repositions self HENT NCAT no JVD MMM pulm scattered rhonchi. Non-productive but rhonchus cough Card RRR abd soft not  tender Ext no sig edema brisk CR Neuro won't open eyes or follow commands. UEs contracted right arm moves. Can extent LEs w/ assist gu clear yellow   Resolved Hospital Problem list   Lactic acidosis   Assessment & Plan:   Acute hypoxic respiratory failure from COVID 19 pneumonia and aspiration pneumonia. pcxr personally reviewed: predom L>R airspace disease. Better aeration today. Enlarged gastric bubble  Plan Cont to wean oxygen  Mobilization as able Pulse ox Aspiration precautions w/ mech soft diet  Day 3 of 5 zosyn Dc vanc Day 3 of 5 remdesivir  Day 3 of 10 systemic steroids  Am cxr  Septic shock from aspiration pneumonia and COVID 19 pneumonia. Has left fem CVL Pressor requirements improving  Plan Keep euvolemic Titrate norepi for SBP > 85 (baeline in 80s per mother) Stop vasopressin abx as above   Non-anion gap Metabolic acidosis, hyponatremia  ->volume status ?? Plan Serial chemistries KVO IVFs Ck urine Na and Osmo  Elevated LFTs-->improveded Plan Trend daily w/ antiviral   Seizure disorder, cerebral palsy, blindness. Plan Cont keppra 500 bid PRN benzo for seizure  Hx of hypopituitarism. Plan Cont decadron Cont synthroid   Vomiting with Gastric distention. Plan Starting diet    Best practice:  Diet: mech soft  DVT prophylaxis: lovenox GI prophylaxis: Not indicated Mobility: Bed rest Code Status: full code Family Communication: daily update  Disposition: ICU    Critical care time:  31 minutes    Simonne Martinet ACNP-BC [facility] Pulmonary/Critical Care  Pager # [phone] OR # [phone] if no answer

## 2019-04-24 NOTE — Progress Notes (Signed)
Assisted tele visit to patient with mother and father.  Kekai Geter M Albertina Leise, RN   

## 2019-04-25 LAB — BASIC METABOLIC PANEL
Anion gap: 10 (ref 5–15)
BUN: 18 mg/dL (ref 6–20)
CO2: 23 mmol/L (ref 22–32)
Calcium: 7.8 mg/dL — ABNORMAL LOW (ref 8.9–10.3)
Chloride: 106 mmol/L (ref 98–111)
Creatinine, Ser: 0.65 mg/dL (ref 0.61–1.24)
GFR calc Af Amer: >60 mL/min (ref >60)
GFR calc non Af Amer: >60 mL/min (ref >60)
Glucose, Bld: 160 mg/dL — ABNORMAL HIGH (ref 70–99)
Potassium: 3.7 mmol/L (ref 3.5–5.1)
Sodium: 139 mmol/L (ref 135–145)

## 2019-04-25 LAB — GLUCOSE, CAPILLARY
Glucose-Capillary: 109 mg/dL — ABNORMAL HIGH (ref 70–99)
Glucose-Capillary: 115 mg/dL — ABNORMAL HIGH (ref 70–99)
Glucose-Capillary: 125 mg/dL — ABNORMAL HIGH (ref 70–99)
Glucose-Capillary: 135 mg/dL — ABNORMAL HIGH (ref 70–99)
Glucose-Capillary: 152 mg/dL — ABNORMAL HIGH (ref 70–99)

## 2019-04-25 LAB — MAGNESIUM: Magnesium: 1.9 mg/dL (ref 1.7–2.4)

## 2019-04-25 MED ORDER — CARBAMAZEPINE 200 MG PO TABS
400.0000 mg | ORAL_TABLET | Freq: Two times a day (BID) | ORAL | Status: DC
  Administered 2019-04-25 – 2019-04-30 (×9): 400 mg via ORAL
  Filled 2019-04-25 (×14): qty 2

## 2019-04-25 NOTE — Progress Notes (Signed)
Assisted tele visit to patient with mother. ° °Bolivar Koranda M, RN   °

## 2019-04-25 NOTE — Progress Notes (Addendum)
Per critical care rounding: continue to wean levophed gtt, discontinue foley.  1110: Spoked with Craig Foster's mother Craig Foster. Updated on patient status. All questions answered at this time. Plan for elink visit today at 1400.  1234: Craig Foster with elink notified of family wish to do an elink visit with Craig Foster today at 1400.  1235: Father Craig Foster updated on Craig Foster's status. All questions answered at this time. Plan for elink visit today at 1400.  1405: Mother on elink visit with Craig Foster. Father unable to join at this time.  1413: Father called wishing to do an elink visit with Craig Foster. Craig Foster notified. Father placed on elink visit.

## 2019-04-25 NOTE — Progress Notes (Signed)
[facility] PCCM  I called and updated his father on plan of care  Heber [place], MD [place] [facility] Pager: [phone] Cell: [phone] If no response, call [phone]

## 2019-04-25 NOTE — Progress Notes (Addendum)
NAME:  Craig Foster, MRN:  [id number], DOB:  1990-11-16, LOS: 3 ADMISSION DATE:  04/22/2019, CONSULTATION DATE:  1/20 REFERRING MD:  Elvera Lennox, CHIEF COMPLAINT:  Dyspnea   Brief History   29 yo with hx of CP, seizure disorder, developmental delay brought to [facility] ER with dyspnea, fever.  Tested positive for COVID 19 on 1/21.  Had seizure, vomiting, and then aspiration.  Transferred to [facility] for further management.  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   Significant Hospital Events   1/20 transfer from [facility] 1/21 weaning pressors, lactic acid cleared  1/22 down to 6 liters. Vasopressin off. Still on low dose pressors. Shooting for SBP > 85. Na dropping. 140->131->124. Urine studies sent. KVO IVFs.  Consults:    Procedures:  1/20 femoral CVL> 1/22 1/22 PICC >   Significant Diagnostic Tests:    Micro Data:  SARS CoV2 1/20 [name] >> positive  Antimicrobials:  Decadron 1/20 >> Remdesivir 1/20 >> Zosyn 1/20 >>  vanc 1/20>>>1/22  Interim history/subjective:  Nearly off levophed Sodium better Mental status better  Objective   Blood pressure (!) 82/61, pulse 86, temperature 100 F (37.8 C), temperature source Axillary, resp. rate (!) 27, weight 64.2 kg, SpO2 100 %.        Intake/Output Summary (Last 24 hours) at 04/25/2019 0802 Last data filed at 04/25/2019 0700 Gross per 24 hour  Intake 1238.94 ml  Output 3225 ml  Net -1986.06 ml   Filed Weights   04/23/19 0434 04/24/19 0500 04/25/19 0500  Weight: 62.6 kg 62.4 kg 64.2 kg    Examination:  General:  Resting comfortably in bed HENT: NCAT OP clear PULM: CTA B, normal effort CV: RRR, no mgr GI: BS+, soft, nontender MSK: legs in contractures Neuro: raises head and opens eyes to voice   Resolved  Hospital Problem list    Assessment & Plan:  Acute hypoxemic respiratory failure from COVID 19 pneumonia and aspiration pneumonia: resolved, on room air Monitor O2 saturation Continue zosyn 5 day course remdesivir 5 day course Decadron 10 days  Septic shock from aspiration pneumonia: much better Wean off levophed for SBP > 85  Hyponatremia: better Repeat BMET today and in AM Monitor BMET and UOP Replace electrolytes as needed  Shock liver Repeat LFT in AM  Siezure disorder, cerebral palsy, blindness On keppra at home Restart home tegretol today  Hypopituitarism Decadron  synthroid  Gastric distension: improved Diet as tolerated  Best practice:  Diet: mechanical soft diet Pain/Anxiety/Delirium protocol (if indicated): n/a VAP protocol (if indicated): n/a DVT prophylaxis: lovenox GI prophylaxis: n/a Glucose control: monitor glucose, SSI Mobility: bed rest Code Status: full Family Communication: updated on 1/22 Disposition: remain in ICU  Labs   CBC: Recent Labs  Lab 04/22/19 1719 04/23/19 0417  WBC  --  31.8*  NEUTROABS  --  28.0*  HGB 11.6* 9.9*  HCT 34.0* 28.5*  MCV  --  86.1  PLT  --  212    Basic Metabolic Panel: Recent Labs  Lab 04/22/19 1730 04/23/19 0417 04/23/19 1830 04/24/19 0600 04/24/19 1445 04/25/19 0420  NA 140 140 131* 124* 136  --   K 3.4* 3.7 4.6 4.1 3.9  --   CL 111 109 105 98 108  --   CO2 13* 15* 16* 17* 19*  --   GLUCOSE  126* 95 130* 153* 101*  --   BUN 14 12 9 11 12   --   CREATININE 0.97 0.79 0.57* 0.49* 0.49*  --   CALCIUM 8.1* 7.8* 7.5* 7.4* 7.6*  --   MG 1.3* 1.3*  --   --   --  1.9  PHOS 3.3 2.5  --   --   --   --    GFR: CrCl cannot be calculated (Unknown ideal weight.). Recent Labs  Lab 04/22/19 1730 04/23/19 0417 04/23/19 1830  PROCALCITON >150.00  --   --   WBC  --  31.8*  --   LATICACIDVEN  --  5.3* 1.7    Liver Function Tests: Recent Labs  Lab 04/22/19 1730 04/23/19 0417 04/24/19 0600  AST 379*  239* 96*  ALT 246* 215* 133*  ALKPHOS 139* 116 87  BILITOT 1.3* 0.9 0.8  PROT 6.4* 5.9* 5.1*  ALBUMIN 3.2* 3.0* 2.5*   No results for input(s): LIPASE, AMYLASE in the last 168 hours. No results for input(s): AMMONIA in the last 168 hours.  ABG    Component Value Date/Time   PHART 7.295 (L) 04/22/2019 1719   PCO2ART 27.1 (L) 04/22/2019 1719   PO2ART 51.0 (L) 04/22/2019 1719   HCO3 13.1 (L) 04/22/2019 1719   TCO2 14 (L) 04/22/2019 1719   ACIDBASEDEF 12.0 (H) 04/22/2019 1719   O2SAT 81.0 04/22/2019 1719     Coagulation Profile: No results for input(s): INR, PROTIME in the last 168 hours.  Cardiac Enzymes: No results for input(s): CKTOTAL, CKMB, CKMBINDEX, TROPONINI in the last 168 hours.  HbA1C: Hgb A1c MFr Bld  Date/Time Value Ref Range Status  04/23/2019 04:17 AM 5.3 4.8 - 5.6 % Final    Comment:    (NOTE) Pre diabetes:          5.7%-6.4% Diabetes:              >6.4% Glycemic control for   <7.0% adults with diabetes     CBG: Recent Labs  Lab 04/24/19 1130 04/24/19 1547 04/24/19 1950 04/24/19 2326 04/25/19 0358  GLUCAP 103* 89 152* 130* 152*     Critical care time: 91 mintues      Roselie Awkward, MD [place] [facility] Pager: [phone] Cell: [phone] If no response, call [phone]

## 2019-04-25 NOTE — Progress Notes (Signed)
Assisted tele visit to patient with father.  Ashia Dehner M, RN   

## 2019-04-25 NOTE — Progress Notes (Signed)
[facility] PCCM  Father notes that his blood pressure is very low at baseline.  Have instructed nursing to tolerate a BP as low as 70 systolic overnight or if MAP < 55 with oliguria or decrease in level of consciousness.    Heber [place], MD [place] [facility] Pager: [phone] Cell: [phone] If no response, call [phone]

## 2019-04-26 LAB — HEPATIC FUNCTION PANEL
ALT: 64 U/L — ABNORMAL HIGH (ref 0–44)
AST: 23 U/L (ref 15–41)
Albumin: 2.5 g/dL — ABNORMAL LOW (ref 3.5–5.0)
Alkaline Phosphatase: 92 U/L (ref 38–126)
Bilirubin, Direct: 0.2 mg/dL (ref 0.0–0.2)
Indirect Bilirubin: 0.2 mg/dL — ABNORMAL LOW (ref 0.3–0.9)
Total Bilirubin: 0.4 mg/dL (ref 0.3–1.2)
Total Protein: 5.2 g/dL — ABNORMAL LOW (ref 6.5–8.1)

## 2019-04-26 LAB — BASIC METABOLIC PANEL
Anion gap: 5 (ref 5–15)
BUN: 21 mg/dL — ABNORMAL HIGH (ref 6–20)
CO2: 26 mmol/L (ref 22–32)
Calcium: 7.3 mg/dL — ABNORMAL LOW (ref 8.9–10.3)
Chloride: 107 mmol/L (ref 98–111)
Creatinine, Ser: 0.42 mg/dL — ABNORMAL LOW (ref 0.61–1.24)
GFR calc Af Amer: >60 mL/min (ref >60)
GFR calc non Af Amer: >60 mL/min (ref >60)
Glucose, Bld: 133 mg/dL — ABNORMAL HIGH (ref 70–99)
Potassium: 3.2 mmol/L — ABNORMAL LOW (ref 3.5–5.1)
Sodium: 138 mmol/L (ref 135–145)

## 2019-04-26 LAB — GLUCOSE, CAPILLARY
Glucose-Capillary: 131 mg/dL — ABNORMAL HIGH (ref 70–99)
Glucose-Capillary: 128 mg/dL — ABNORMAL HIGH (ref 70–99)
Glucose-Capillary: 102 mg/dL — ABNORMAL HIGH (ref 70–99)
Glucose-Capillary: 102 mg/dL — ABNORMAL HIGH (ref 70–99)
Glucose-Capillary: 83 mg/dL (ref 70–99)
Glucose-Capillary: 81 mg/dL (ref 70–99)

## 2019-04-26 MED ORDER — LEVETIRACETAM 500 MG PO TABS
1500.0000 mg | ORAL_TABLET | Freq: Two times a day (BID) | ORAL | Status: DC
  Filled 2019-04-26: qty 2
  Filled 2019-04-26: qty 3

## 2019-04-26 MED ORDER — ENOXAPARIN SODIUM 40 MG/0.4ML ~~LOC~~ SOLN
40.0000 mg | SUBCUTANEOUS | Status: DC
  Administered 2019-04-27 – 2019-04-28 (×2): 40 mg via SUBCUTANEOUS
  Filled 2019-04-26 (×2): qty 0.4

## 2019-04-26 MED ORDER — LEVOTHYROXINE SODIUM 175 MCG PO TABS
87.5000 ug | ORAL_TABLET | Freq: Once | ORAL | Status: DC
  Filled 2019-04-26: qty 0.5

## 2019-04-26 MED ORDER — HYDROCORTISONE 20 MG PO TABS
20.0000 mg | ORAL_TABLET | Freq: Two times a day (BID) | ORAL | Status: DC
  Administered 2019-04-26: 20 mg via ORAL
  Filled 2019-04-26 (×3): qty 1

## 2019-04-26 MED ORDER — LORAZEPAM 2 MG/ML IJ SOLN: 2.0000 mg | INTRAMUSCULAR | Status: DC | PRN

## 2019-04-26 MED ORDER — LEVOTHYROXINE SODIUM 75 MCG PO TABS
175.0000 ug | ORAL_TABLET | Freq: Every day | ORAL | Status: DC
  Administered 2019-04-27 – 2019-04-30 (×4): 175 ug via ORAL
  Filled 2019-04-26 (×5): qty 1

## 2019-04-26 NOTE — Progress Notes (Signed)
Assisted tele visit to patient with mother. ° °Aryana Wonnacott M, RN   °

## 2019-04-26 NOTE — Progress Notes (Signed)
Pharmacy Antibiotic Note/Lovenox  Craig Foster is a 29 y.o. male with a h/o CP and seizure disorder admitted on 04/22/2019 with acute respiratory failure from COVID-19 PNA and aspiraton PNA. On D5 of Zosyn. SCr wnl  Plan: -Zosyn 3.375 g EI q 8 h. F/u LOT   Weight: 146 lb 6.2 oz (66.4 kg)  Temp (24hrs), Avg:99.7 F (37.6 C), Min:98.8 F (37.1 C), Max:101.6 F (38.7 C)  Recent Labs  Lab 04/23/19 0417 04/23/19 0417 04/23/19 1830 04/24/19 0600 04/24/19 0930 04/24/19 1445 04/25/19 0420 04/26/19 0500  WBC 31.8*  --   --   --   --   --   --   --   CREATININE 0.79   < > 0.57* 0.49*  --  0.49* 0.65 0.42*  LATICACIDVEN 5.3*  --  1.7  --   --   --   --   --   VANCOTROUGH  --   --   --   --  18  --   --   --    < > = values in this interval not displayed.    CrCl cannot be calculated (Unknown ideal weight.).    Allergies  Allergen Reactions  . Azithromycin Other (See Comments)    Contraindicated with his tegretol, causes tegretol toxicity.    . Erythromycin Other (See Comments)    Causes tegretol toxicity  . Risperidone And Related Nausea And Vomiting and Other (See Comments)    Other reaction(s): Nausea And Vomiting Severe pain   . Tape Rash    Antimicrobials this admission: 1/20 Zosyn >> 1/21 Vanc >>  1/22  Microbiology results: 1/20 MRSA PCR: positive  1/20 BCx [facility]): NGTD x 4 days (called on 1/24 to confirm)   Thank you for allowing pharmacy to be a part of this patient's care.  Vinnie Level, PharmD., BCPS Clinical Pharmacist Clinical phone for 04/26/19 until 5pm: [phone]

## 2019-04-26 NOTE — Progress Notes (Signed)
Assisted tele visit to patient with father.  Nylani Michetti M, RN   

## 2019-04-26 NOTE — Progress Notes (Signed)
Patient will not take PO meds after nurse crushed and put them in applesauce and pudding. Nurse paged attending to make aware of this issue. Patient has hx of seizures and is on PO Keppra and PO meds in PCU.

## 2019-04-26 NOTE — Progress Notes (Signed)
PROGRESS NOTE  Craig Foster  KWI:[id number] DOB: [birth year] DOA: 04/22/2019 PCP: Patient, No Pcp Per  Brief Narrative: Craig Foster is a 29 y.o. male with a history of CP, developmental delay, hypopituitarism, blindness, and seizure disorder who presented to [facility] ED 1/20 with fever and dyspnea. He had seizure activity with vomiting and aspiration as well as positive SARS-CoV-2 testing for which antibiotics, remdesivir, and steroids were started. He was admitted to [facility] ICU with acute hypoxic respiratory failure with septic shock due to aspiration pneumonia and covid-19 pneumonia. On 1/21 lactic acid cleared. On 1/23 pressors were weaned off and hypoxemia has shown significant improvement.   Assessment & Plan: Active Problems:   Respiratory failure (HCC)   Aspiration pneumonia of both lungs due to vomit (HCC)   Septic shock (HCC)   Hyponatremia  Acute hypoxemic respiratory failure due to covid-19 pneumonia and aspiration pneumonitis/pneumonia: SARS-CoV-2 PCR positive on 1/20 at [facility].  - Completed remdesivir x5 days 1/20 - 1/24 - Steroids given. Due to severe concomitant bacterial infection, return to 100% SpO2 on room air, and hypotension in hypopit patient, we will change decadron back to cortef. - Continue antibiotics. Remained febrile on 1/23. Plan 7 days Tx with zosyn. Vancomycin stopped. - Continue airborne, contact precautions for 21 days from positive testing. - Check CBC w/diff, CMP, DD, CRP tomorrow - Enoxaparin intermediate dose, will deescalate to usual dosing.  - Maintain euvolemia/net negative.  - Avoid NSAIDs  - Dysphagia 3 diet  Septic shock due to aspiration pneumonia, covid-19 pneumonia:  - Resolved shock, though remains hypotensive largely near his baseline. Despite low BP, creatinine and mentation are at his baseline per parents. - DC A-line - Keep PICC line RUE due to poor access  Seizure disorder, hydrocephalus with VP shunt:  - Continue home keppra 1,500mg  po BID,  carbamazepine 400mg  BID - Continue prn lorazepam IV while in hospital.  Panhypopituitarism:  - Add hydrocortisone back given persistent hypotension for mineralocorticoid effect which is minimal w/decadron.  - Continue synthroid 126mcg daily, can convert back to po.  Hyponatremia: Resolved - Continue monitoring.  Vomiting: Tolerating po, abdomen benign.   NAGMA: Resolved.  Cerebral palsy:  - PT, OT, has assistance for total care at home.  DVT prophylaxis: Lovenox 40mg  daily Code Status: Full Family Communication: Will update family by phone Disposition Plan: Transfer to PCU  Consultants:   PCCM primary 1/20 - 1/23  Procedures:   Femoral CVL 1/20 - 1/22  RUE PICC 1/22 >>   Antimicrobials:  Vancomycin 1/20 - 1/22  Zosyn 1/20 - 1/26  Remdesivir 1/20 - 1/24  Subjective: Not verbally responsive, eating some glucerna, no vomiting. Had an uneventful night but BPs remain low. Fever yesterday noted.  Objective: Vitals:   04/26/19 0700 04/26/19 0800 04/26/19 0900 04/26/19 1000  BP: (!) 80/54 (!) 85/53 (!) 81/50 (!) 80/49  Pulse: 74 74 76 93  Resp: 20 (!) 21 (!) 27 (!) 27  Temp:  99.1 F (37.3 C)    TempSrc:  Axillary    SpO2: 99% 100% 100% 90%  Weight:        Intake/Output Summary (Last 24 hours) at 04/26/2019 1201 Last data filed at 04/26/2019 0700 Gross per 24 hour  Intake 1603.02 ml  Output 150 ml  Net 1453.02 ml   Filed Weights   04/24/19 0500 04/25/19 0500 04/26/19 0439  Weight: 62.4 kg 64.2 kg 66.4 kg    Gen: 29 y.o. male in no distress Pulm: Non-labored breathing room air at rest. Clear  to auscultation bilaterally.  CV: Regular rate and rhythm. No murmur, rub, or gallop. No JVD, no pedal edema. GI: Abdomen soft, non-tender, non-distended, with normoactive bowel sounds. No organomegaly or masses felt. Ext: Warm, dry, LE contractures noted Skin: No rashes, lesions or ulcers Neuro: Rousable, opens eyes to voice Psych: UTD  Data Reviewed: I have  personally reviewed following labs and imaging studies  CBC: Recent Labs  Lab 04/22/19 1719 04/23/19 0417  WBC  --  31.8*  NEUTROABS  --  28.0*  HGB 11.6* 9.9*  HCT 34.0* 28.5*  MCV  --  86.1  PLT  --  212   Basic Metabolic Panel: Recent Labs  Lab 04/22/19 1730 04/22/19 1730 04/23/19 0417 04/23/19 0417 04/23/19 1830 04/24/19 0600 04/24/19 1445 04/25/19 0420 04/26/19 0500  NA 140   < > 140   < > 131* 124* 136 139 138  K 3.4*   < > 3.7   < > 4.6 4.1 3.9 3.7 3.2*  CL 111   < > 109   < > 105 98 108 106 107  CO2 13*   < > 15*   < > 16* 17* 19* 23 26  GLUCOSE 126*   < > 95   < > 130* 153* 101* 160* 133*  BUN 14   < > 12   < > 9 11 12 18  21*  CREATININE 0.97   < > 0.79   < > 0.57* 0.49* 0.49* 0.65 0.42*  CALCIUM 8.1*   < > 7.8*   < > 7.5* 7.4* 7.6* 7.8* 7.3*  MG 1.3*  --  1.3*  --   --   --   --  1.9  --   PHOS 3.3  --  2.5  --   --   --   --   --   --    < > = values in this interval not displayed.   GFR: CrCl cannot be calculated (Unknown ideal weight.). Liver Function Tests: Recent Labs  Lab 04/22/19 1730 04/23/19 0417 04/24/19 0600 04/26/19 0500  AST 379* 239* 96* 23  ALT 246* 215* 133* 64*  ALKPHOS 139* 116 87 92  BILITOT 1.3* 0.9 0.8 0.4  PROT 6.4* 5.9* 5.1* 5.2*  ALBUMIN 3.2* 3.0* 2.5* 2.5*   No results for input(s): LIPASE, AMYLASE in the last 168 hours. No results for input(s): AMMONIA in the last 168 hours. Coagulation Profile: No results for input(s): INR, PROTIME in the last 168 hours. Cardiac Enzymes: No results for input(s): CKTOTAL, CKMB, CKMBINDEX, TROPONINI in the last 168 hours. BNP (last 3 results) No results for input(s): PROBNP in the last 8760 hours. HbA1C: No results for input(s): HGBA1C in the last 72 hours. CBG: Recent Labs  Lab 04/25/19 1919 04/25/19 2345 04/26/19 0435 04/26/19 0735 04/26/19 1125  GLUCAP 115* 135* 128* 102* 102*   Lipid Profile: No results for input(s): CHOL, HDL, LDLCALC, TRIG, CHOLHDL, LDLDIRECT in the  last 72 hours. Thyroid Function Tests: No results for input(s): TSH, T4TOTAL, FREET4, T3FREE, THYROIDAB in the last 72 hours. Anemia Panel: Recent Labs    04/24/19 0600  FERRITIN 132   Urine analysis: No results found for: COLORURINE, APPEARANCEUR, LABSPEC, PHURINE, GLUCOSEU, HGBUR, BILIRUBINUR, KETONESUR, PROTEINUR, UROBILINOGEN, NITRITE, LEUKOCYTESUR Recent Results (from the past 240 hour(s))  MRSA PCR Screening     Status: Abnormal   Collection Time: 04/22/19  6:00 PM   Specimen: Nasal Mucosa; Nasopharyngeal  Result Value Ref Range Status   MRSA by PCR  POSITIVE (A) NEGATIVE Final    Comment:        The GeneXpert MRSA Assay (FDA approved for NASAL specimens only), is one component of a comprehensive MRSA colonization surveillance program. It is not intended to diagnose MRSA infection nor to guide or monitor treatment for MRSA infections. CRITICAL RESULT CALLED TO, READ BACK BY AND VERIFIED WITH: RN RBV HOWARD AT 4650 04/23/19 CRUICKSHANK A Performed at [facility], [address], [place], [place] [address]       Radiology Studies: No results found.  Scheduled Meds: . carbamazepine  400 mg Oral BID  . chlorhexidine  15 mL Mouth Rinse BID  . Chlorhexidine Gluconate Cloth  6 each Topical Daily  . dexamethasone (DECADRON) injection  6 mg Intravenous Q24H  . enoxaparin (LOVENOX) injection  40 mg Subcutaneous BID  . feeding supplement (ENSURE ENLIVE)  237 mL Oral TID BM  . insulin aspart  0-9 Units Subcutaneous Q4H  . levothyroxine  87.5 mcg Intravenous Daily  . mouth rinse  15 mL Mouth Rinse q12n4p  . mupirocin ointment   Nasal BID  . sodium chloride flush  10-40 mL Intracatheter Q12H   Continuous Infusions: . sodium chloride    . sodium chloride    . dextrose 5% lactated ringers 10 mL/hr at 04/26/19 0700  . levETIRAcetam Stopped (04/26/19 0454)  . norepinephrine (LEVOPHED) Adult infusion Stopped (04/25/19 1621)  . piperacillin-tazobactam  (ZOSYN)  IV 12.5 mL/hr at 04/26/19 0700     LOS: 4 days   Time spent: 35 minutes.  Tyrone Nine, MD [facility] [email or web address] 04/26/2019, 12:01 PM

## 2019-04-27 LAB — C-REACTIVE PROTEIN: CRP: 4.9 mg/dL — ABNORMAL HIGH (ref <1.0)

## 2019-04-27 LAB — CBC WITH DIFFERENTIAL/PLATELET
Abs Immature Granulocytes: 0.04 10*3/uL (ref 0.00–0.07)
Basophils Absolute: 0 10*3/uL (ref 0.0–0.1)
Basophils Relative: 0 %
Eosinophils Absolute: 0 10*3/uL (ref 0.0–0.5)
Eosinophils Relative: 1 %
HCT: 22.4 % — ABNORMAL LOW (ref 39.0–52.0)
Hemoglobin: 7.5 g/dL — ABNORMAL LOW (ref 13.0–17.0)
Immature Granulocytes: 1 %
Lymphocytes Relative: 52 %
Lymphs Abs: 3.4 10*3/uL (ref 0.7–4.0)
MCH: 29.2 pg (ref 26.0–34.0)
MCHC: 33.5 g/dL (ref 30.0–36.0)
MCV: 87.2 fL (ref 80.0–100.0)
Monocytes Absolute: 0.7 10*3/uL (ref 0.1–1.0)
Monocytes Relative: 11 %
Neutro Abs: 2.3 10*3/uL (ref 1.7–7.7)
Neutrophils Relative %: 35 %
Platelets: 130 10*3/uL — ABNORMAL LOW (ref 150–400)
RBC: 2.57 MIL/uL — ABNORMAL LOW (ref 4.22–5.81)
RDW: 12.5 % (ref 11.5–15.5)
WBC: 6.4 10*3/uL (ref 4.0–10.5)
nRBC: 0 % (ref 0.0–0.2)

## 2019-04-27 LAB — GLUCOSE, CAPILLARY
Glucose-Capillary: 81 mg/dL (ref 70–99)
Glucose-Capillary: 57 mg/dL — ABNORMAL LOW (ref 70–99)
Glucose-Capillary: 66 mg/dL — ABNORMAL LOW (ref 70–99)
Glucose-Capillary: 77 mg/dL (ref 70–99)
Glucose-Capillary: 66 mg/dL — ABNORMAL LOW (ref 70–99)
Glucose-Capillary: 100 mg/dL — ABNORMAL HIGH (ref 70–99)
Glucose-Capillary: 125 mg/dL — ABNORMAL HIGH (ref 70–99)
Glucose-Capillary: 134 mg/dL — ABNORMAL HIGH (ref 70–99)
Glucose-Capillary: 146 mg/dL — ABNORMAL HIGH (ref 70–99)

## 2019-04-27 LAB — COMPREHENSIVE METABOLIC PANEL
ALT: 46 U/L — ABNORMAL HIGH (ref 0–44)
AST: 22 U/L (ref 15–41)
Albumin: 2.5 g/dL — ABNORMAL LOW (ref 3.5–5.0)
Alkaline Phosphatase: 90 U/L (ref 38–126)
Anion gap: 8 (ref 5–15)
BUN: 23 mg/dL — ABNORMAL HIGH (ref 6–20)
CO2: 24 mmol/L (ref 22–32)
Calcium: 7.2 mg/dL — ABNORMAL LOW (ref 8.9–10.3)
Chloride: 106 mmol/L (ref 98–111)
Creatinine, Ser: 0.65 mg/dL (ref 0.61–1.24)
GFR calc Af Amer: >60 mL/min (ref >60)
GFR calc non Af Amer: >60 mL/min (ref >60)
Glucose, Bld: 75 mg/dL (ref 70–99)
Potassium: 2.4 mmol/L — CL (ref 3.5–5.1)
Sodium: 138 mmol/L (ref 135–145)
Total Bilirubin: 0.6 mg/dL (ref 0.3–1.2)
Total Protein: 4.9 g/dL — ABNORMAL LOW (ref 6.5–8.1)

## 2019-04-27 LAB — D-DIMER, QUANTITATIVE: D-Dimer, Quant: 3.25 ug/mL-FEU — ABNORMAL HIGH (ref 0.00–0.50)

## 2019-04-27 MED ORDER — LEVETIRACETAM IN NACL 1500 MG/100ML IV SOLN
1500.0000 mg | Freq: Two times a day (BID) | INTRAVENOUS | Status: DC
  Administered 2019-04-27 – 2019-04-29 (×5): 1500 mg via INTRAVENOUS
  Filled 2019-04-27 (×6): qty 100

## 2019-04-27 MED ORDER — HYDROCORTISONE NA SUCCINATE PF 100 MG IJ SOLR
50.0000 mg | Freq: Four times a day (QID) | INTRAMUSCULAR | Status: DC
  Administered 2019-04-27 – 2019-04-29 (×7): 50 mg via INTRAVENOUS
  Filled 2019-04-27 (×7): qty 2

## 2019-04-27 MED ORDER — HYDROCORTISONE 20 MG PO TABS
20.0000 mg | ORAL_TABLET | Freq: Three times a day (TID) | ORAL | Status: DC
  Filled 2019-04-27 (×2): qty 1

## 2019-04-27 MED ORDER — SODIUM CHLORIDE 0.9% IV SOLUTION: Freq: Once | INTRAVENOUS | Status: AC

## 2019-04-27 MED ORDER — MAGNESIUM SULFATE 2 GM/50ML IV SOLN
2.0000 g | Freq: Once | INTRAVENOUS | Status: AC
  Administered 2019-04-27: 15:00:00 2 g via INTRAVENOUS
  Filled 2019-04-27: qty 50

## 2019-04-27 MED ORDER — POTASSIUM CHLORIDE 20 MEQ/15ML (10%) PO SOLN
40.0000 meq | Freq: Two times a day (BID) | ORAL | Status: AC
  Administered 2019-04-27 – 2019-04-28 (×3): 40 meq via ORAL
  Filled 2019-04-27 (×3): qty 30

## 2019-04-27 MED ORDER — HYDROCORTISONE 20 MG PO TABS
20.0000 mg | ORAL_TABLET | Freq: Two times a day (BID) | ORAL | Status: DC
  Administered 2019-04-27: 20 mg via ORAL
  Filled 2019-04-27 (×2): qty 1

## 2019-04-27 NOTE — Progress Notes (Signed)
PROGRESS NOTE  Craig Foster  RXV:[id number] DOB: [birth year] DOA: 04/22/2019 PCP: Patient, No Pcp Per  Brief Narrative: Craig Foster is a 29 y.o. male with a history of CP, developmental delay, hypopituitarism, blindness, and seizure disorder who presented to [facility] ED 1/20 with fever and dyspnea. He had seizure activity with vomiting and aspiration as well as positive SARS-CoV-2 testing for which antibiotics, remdesivir, and steroids were started. He was admitted to [facility] ICU with acute hypoxic respiratory failure with septic shock due to aspiration pneumonia and covid-19 pneumonia. On 1/21 lactic acid cleared. On 1/23 pressors were weaned off and hypoxemia has shown significant improvement.   Assessment & Plan: Active Problems:   Respiratory failure (HCC)   Aspiration pneumonia of both lungs due to vomit (HCC)   Septic shock (HCC)   Hyponatremia  Acute hypoxemic respiratory failure due to covid-19 pneumonia and aspiration pneumonitis/pneumonia: SARS-CoV-2 PCR positive on 1/20 at [facility].  - Completed remdesivir x5 days 1/20 - 1/24 - Steroids given. Due to severe concomitant bacterial infection, return to 100% SpO2 on room air, and hypotension in hypopit patient, decadron changed back to hydrocortisone. - Continue antibiotics. Remained febrile on 1/23. Plan 7 days Tx with zosyn. Leukocytosis resolved. Vancomycin stopped. - Continue airborne, contact precautions for 21 days from positive testing. - Check CBC w/diff, CMP, DD, CRP tomorrow - Enoxaparin intermediate dose, will deescalate to usual dosing.  - Maintain euvolemia/net negative.  - Avoid NSAIDs  - Dysphagia 3 diet  Anemia of critical/acute illness: All cell lines down with thrombocytopenia.  - Hgb 7.5g/dl in absence of any bleeding. With hypotension, we will transfuse 1u PRBCs. I have discussed the risks of transfusion with his mother, including life-threatening allergic reaction, and she opts to proceed. Will recheck CBC in AM. - D-dimer >3 and  was >20 on arrival. Still with covid and immobility at very high risk of VTE so risk < benefit of prophylactic dose anticoagulation since there's no bleeding.  Septic shock due to aspiration pneumonia, covid-19 pneumonia:  - Resolved shock, though remains hypotensive largely near his baseline. Despite low BP, creatinine and mentation are at his baseline per parents. - DC'ed A-line - Keep PICC line RUE due to poor access  Seizure disorder, hydrocephalus with VP shunt:  - Continue home keppra 1,500mg  po BID, carbamazepine 400mg  BID. Any inability to give these medications must be reported so IV doses can be given. - Continue prn lorazepam IV while in hospital.  Panhypopituitarism:  - Continue stress-dose hydrocortisone. Augment dose and change to IV as this was not administered last night due to pt refusal and now persistent hypotension, poor po intake ?equivalent of abd pain/no tenderness.  - Continue synthroid daily, can convert back to po.  Hyponatremia: Resolved - Continue monitoring.  ALT elevation: Mild and improving. - Monitor  Vomiting: Tolerating po, abdomen benign.   NAGMA: Resolved.  Cerebral palsy:  - PT, OT, has assistance for total care at home.  DVT prophylaxis: Lovenox 40mg  daily Code Status: Full Family Communication: Mother by phone. Disposition Plan: Transfer to PCU  Consultants:   PCCM primary 1/20 - 1/23  Procedures:   Femoral CVL 1/20 - 1/22  Left radial arterial line DC'ed 1/24  RUE PICC 1/22 >>   Antimicrobials:  Vancomycin 1/20 - 1/22  Zosyn 1/20 - 1/26  Remdesivir 1/20 - 1/24  Subjective: Missed PM doses of AED and steroid. No events noted. RN denies any report of bleeding or current bleeding  Objective: Vitals:   04/26/19 2300  04/27/19 0000 04/27/19 0739 04/27/19 1200  BP:  (!) 75/48 (!) 78/42 (!) 75/44  Pulse: 83 84  95  Resp: 20 20  (!) 30  Temp:  98.7 F (37.1 C) 99.2 F (37.3 C) 98.6 F (37 C)  TempSrc:  Axillary  Axillary Oral  SpO2: 91% 93% 97% 96%  Weight:        Intake/Output Summary (Last 24 hours) at 04/27/2019 1433 Last data filed at 04/27/2019 0700 Gross per 24 hour  Intake -  Output 200 ml  Net -200 ml   Filed Weights   04/24/19 0500 04/25/19 0500 04/26/19 0439  Weight: 62.4 kg 64.2 kg 66.4 kg   Gen: 29 y.o. male in no distress resting quietly Pulm: Nonlabored breathing room air. Clear anteriorly. CV: Regular rate and rhythm. No murmur, rub, or gallop. No JVD, no dependent edema. GI: Abdomen soft, non-tender, non-distended, with normoactive bowel sounds. No rectal bleeding. Ext: Warm, dry with stable LE contractures, arms held above head laying in bed Skin: No new rashes, lesions or ulcers on visualized skin. No bleeding sites. Neuro: Rousable, opens eyes intermittently, doesn't track or verbally respond this morning.  Psych: UTD  Data Reviewed: I have personally reviewed following labs and imaging studies  CBC: Recent Labs  Lab 04/22/19 1719 04/23/19 0417 04/27/19 0618  WBC  --  31.8* 6.4  NEUTROABS  --  28.0* 2.3  HGB 11.6* 9.9* 7.5*  HCT 34.0* 28.5* 22.4*  MCV  --  86.1 87.2  PLT  --  212 130*   Basic Metabolic Panel: Recent Labs  Lab 04/22/19 1730 04/22/19 1730 04/23/19 0417 04/23/19 1830 04/24/19 0600 04/24/19 1445 04/25/19 0420 04/26/19 0500 04/27/19 0618  NA 140   < > 140   < > 124* 136 139 138 138  K 3.4*   < > 3.7   < > 4.1 3.9 3.7 3.2* 2.4*  CL 111   < > 109   < > 98 108 106 107 106  CO2 13*   < > 15*   < > 17* 19* 23 26 24   GLUCOSE 126*   < > 95   < > 153* 101* 160* 133* 75  BUN 14   < > 12   < > 11 12 18  21* 23*  CREATININE 0.97   < > 0.79   < > 0.49* 0.49* 0.65 0.42* 0.65  CALCIUM 8.1*   < > 7.8*   < > 7.4* 7.6* 7.8* 7.3* 7.2*  MG 1.3*  --  1.3*  --   --   --  1.9  --   --   PHOS 3.3  --  2.5  --   --   --   --   --   --    < > = values in this interval not displayed.   GFR: CrCl cannot be calculated (Unknown ideal weight.). Liver Function  Tests: Recent Labs  Lab 04/22/19 1730 04/23/19 0417 04/24/19 0600 04/26/19 0500 04/27/19 0618  AST 379* 239* 96* 23 22  ALT 246* 215* 133* 64* 46*  ALKPHOS 139* 116 87 92 90  BILITOT 1.3* 0.9 0.8 0.4 0.6  PROT 6.4* 5.9* 5.1* 5.2* 4.9*  ALBUMIN 3.2* 3.0* 2.5* 2.5* 2.5*   No results for input(s): LIPASE, AMYLASE in the last 168 hours. No results for input(s): AMMONIA in the last 168 hours. Coagulation Profile: No results for input(s): INR, PROTIME in the last 168 hours. Cardiac Enzymes: No results for input(s): CKTOTAL, CKMB, CKMBINDEX,  TROPONINI in the last 168 hours. BNP (last 3 results) No results for input(s): PROBNP in the last 8760 hours. HbA1C: No results for input(s): HGBA1C in the last 72 hours. CBG: Recent Labs  Lab 04/27/19 0523 04/27/19 0550 04/27/19 0717 04/27/19 0817 04/27/19 1227  GLUCAP 57* 66* 77 66* 100*   Lipid Profile: No results for input(s): CHOL, HDL, LDLCALC, TRIG, CHOLHDL, LDLDIRECT in the last 72 hours. Thyroid Function Tests: No results for input(s): TSH, T4TOTAL, FREET4, T3FREE, THYROIDAB in the last 72 hours. Anemia Panel: No results for input(s): VITAMINB12, FOLATE, FERRITIN, TIBC, IRON, RETICCTPCT in the last 72 hours. Urine analysis: No results found for: COLORURINE, APPEARANCEUR, LABSPEC, Powers, GLUCOSEU, HGBUR, [place], Millard, [place], Kaufman, NITRITE, LEUKOCYTESUR Recent Results (from the past 240 hour(s))  MRSA PCR Screening     Status: Abnormal   Collection Time: 04/22/19  6:00 PM   Specimen: Nasal Mucosa; Nasopharyngeal  Result Value Ref Range Status   MRSA by PCR POSITIVE (A) NEGATIVE Final    Comment:        The GeneXpert MRSA Assay (FDA approved for NASAL specimens only), is one component of a comprehensive MRSA colonization surveillance program. It is not intended to diagnose MRSA infection nor to guide or monitor treatment for MRSA infections. CRITICAL RESULT CALLED TO, READ BACK BY AND VERIFIED  WITH: RN RBV HOWARD AT [id number] 04/23/19 CRUICKSHANK A Performed at [facility], [place] [address] [place], [place] [address]       Radiology Studies: No results found.  Scheduled Meds: . carbamazepine  400 mg Oral BID  . chlorhexidine  15 mL Mouth Rinse BID  . Chlorhexidine Gluconate Cloth  6 each Topical Daily  . enoxaparin (LOVENOX) injection  40 mg Subcutaneous Q24H  . feeding supplement (ENSURE ENLIVE)  237 mL Oral TID BM  . hydrocortisone  20 mg Oral BID  . insulin aspart  0-9 Units Subcutaneous Q4H  . levothyroxine  175 mcg Oral Q0600  . mouth rinse  15 mL Mouth Rinse q12n4p  . mupirocin ointment   Nasal BID  . potassium chloride  40 mEq Oral BID  . sodium chloride flush  10-40 mL Intracatheter Q12H   Continuous Infusions: . dextrose 5% lactated ringers 10 mL/hr at 04/26/19 0700  . levETIRAcetam 1,500 mg (04/27/19 0849)  . piperacillin-tazobactam (ZOSYN)  IV 3.375 g (04/27/19 0526)     LOS: 5 days   Time spent: 35 minutes.  Patrecia Pour, MD [facility] [email or web address] 04/27/2019, 2:33 PM

## 2019-04-27 NOTE — Progress Notes (Signed)
MD in the room, I made him aware of patient's low BP.

## 2019-04-27 NOTE — Progress Notes (Signed)
Patient takes PO meds mixed in liquids. Night shift nurse was told in crushed in apple sauce which patient refused.

## 2019-04-28 ENCOUNTER — Inpatient Hospital Stay (HOSPITAL_COMMUNITY): Payer: Medicare Other

## 2019-04-28 LAB — GLUCOSE, CAPILLARY
Glucose-Capillary: 111 mg/dL — ABNORMAL HIGH (ref 70–99)
Glucose-Capillary: 104 mg/dL — ABNORMAL HIGH (ref 70–99)
Glucose-Capillary: 84 mg/dL (ref 70–99)
Glucose-Capillary: 97 mg/dL (ref 70–99)
Glucose-Capillary: 101 mg/dL — ABNORMAL HIGH (ref 70–99)
Glucose-Capillary: 106 mg/dL — ABNORMAL HIGH (ref 70–99)

## 2019-04-28 LAB — CBC
HCT: 22.7 % — ABNORMAL LOW (ref 39.0–52.0)
Hemoglobin: 7.7 g/dL — ABNORMAL LOW (ref 13.0–17.0)
MCH: 30.1 pg (ref 26.0–34.0)
MCHC: 33.9 g/dL (ref 30.0–36.0)
MCV: 88.7 fL (ref 80.0–100.0)
Platelets: 157 10*3/uL (ref 150–400)
RBC: 2.56 MIL/uL — ABNORMAL LOW (ref 4.22–5.81)
RDW: 12.5 % (ref 11.5–15.5)
WBC: 4.6 10*3/uL (ref 4.0–10.5)
nRBC: 0 % (ref 0.0–0.2)

## 2019-04-28 LAB — COMPREHENSIVE METABOLIC PANEL
ALT: 36 U/L (ref 0–44)
AST: 19 U/L (ref 15–41)
Albumin: 2.5 g/dL — ABNORMAL LOW (ref 3.5–5.0)
Alkaline Phosphatase: 85 U/L (ref 38–126)
Anion gap: 5 (ref 5–15)
BUN: 20 mg/dL (ref 6–20)
CO2: 24 mmol/L (ref 22–32)
Calcium: 7.3 mg/dL — ABNORMAL LOW (ref 8.9–10.3)
Chloride: 110 mmol/L (ref 98–111)
Creatinine, Ser: 0.56 mg/dL — ABNORMAL LOW (ref 0.61–1.24)
GFR calc Af Amer: >60 mL/min (ref >60)
GFR calc non Af Amer: >60 mL/min (ref >60)
Glucose, Bld: 136 mg/dL — ABNORMAL HIGH (ref 70–99)
Potassium: 3.6 mmol/L (ref 3.5–5.1)
Sodium: 139 mmol/L (ref 135–145)
Total Bilirubin: 0.5 mg/dL (ref 0.3–1.2)
Total Protein: 5.2 g/dL — ABNORMAL LOW (ref 6.5–8.1)

## 2019-04-28 LAB — PREPARE RBC (CROSSMATCH)

## 2019-04-28 LAB — MAGNESIUM: Magnesium: 2.6 mg/dL — ABNORMAL HIGH (ref 1.7–2.4)

## 2019-04-28 LAB — ABO/RH: ABO/RH(D): AB POS

## 2019-04-28 MED ORDER — IOHEXOL 300 MG/ML  SOLN
75.0000 mL | Freq: Once | INTRAMUSCULAR | Status: AC | PRN
  Administered 2019-04-28: 11:00:00 75 mL via INTRAVENOUS

## 2019-04-28 MED ORDER — SODIUM CHLORIDE 0.9% IV SOLUTION: Freq: Once | INTRAVENOUS | Status: AC

## 2019-04-28 MED ORDER — PANTOPRAZOLE SODIUM 40 MG IV SOLR
40.0000 mg | Freq: Two times a day (BID) | INTRAVENOUS | Status: DC
  Administered 2019-04-28 – 2019-04-29 (×3): 40 mg via INTRAVENOUS
  Filled 2019-04-28 (×3): qty 40

## 2019-04-28 NOTE — Progress Notes (Signed)
Pharmacy Antibiotic Note  Craig Foster is a 29 y.o. male admitted on 04/22/2019 with COVID/sepsis/aspiration.  Pharmacy has been consulted for Zosyn dosing.  ID: Asp pna, Septic shock, WBC 31>4.6, CRP 25>4.9, LA 5.3>1.7, PCT > 150. Afebrile  1/20 Zosyn >>  1/21 Vanc >> 1/22 1/22 Remdesivir>>1/24  1/20 MRSA PCR: positive  1/20 BCx ([facility]): NGTD x 4 days (called on 1/24 to confirm)    Plan: #7 Zosyn 3.375 g EI q 8 h. F/u LOT Pharmacy will sign off. Please reconsult for further dosing assitance.   Weight: 141 lb 12.1 oz (64.3 kg)  Temp (24hrs), Avg:98.3 F (36.8 C), Min:97.6 F (36.4 C), Max:98.9 F (37.2 C)  Recent Labs  Lab 04/23/19 0417 04/23/19 0417 04/23/19 1830 04/24/19 0600 04/24/19 0930 04/24/19 1445 04/25/19 0420 04/26/19 0500 04/27/19 0618 04/28/19 0426  WBC 31.8*  --   --   --   --   --   --   --  6.4 4.6  CREATININE 0.79   < > 0.57*   < >  --  0.49* 0.65 0.42* 0.65 0.56*  LATICACIDVEN 5.3*  --  1.7  --   --   --   --   --   --   --   VANCOTROUGH  --   --   --   --  18  --   --   --   --   --    < > = values in this interval not displayed.    CrCl cannot be calculated (Unknown ideal weight.).    Allergies  Allergen Reactions  . Azithromycin Other (See Comments)    Contraindicated with his tegretol, causes tegretol toxicity.    . Erythromycin Other (See Comments)    Causes tegretol toxicity  . Risperidone And Related Nausea And Vomiting and Other (See Comments)    Other reaction(s): Nausea And Vomiting Severe pain   . Tape Rash     Craig Foster S. Merilynn Finland, PharmD, BCPS Clinical Staff Pharmacist [email or web address] Pasty Spillers 04/28/2019 10:50 AM

## 2019-04-28 NOTE — Plan of Care (Signed)
  Problem: Education: Goal: Knowledge of General Education information will improve Description: Including pain rating scale, medication(s)/side effects and non-pharmacologic comfort measures Outcome: Progressing   Problem: Health Behavior/Discharge Planning: Goal: Ability to manage health-related needs will improve Outcome: Progressing   Problem: Clinical Measurements: Goal: Ability to maintain clinical measurements within normal limits will improve Outcome: Progressing Goal: Will remain free from infection Outcome: Progressing Goal: Diagnostic test results will improve Outcome: Progressing Goal: Respiratory complications will improve Outcome: Progressing Goal: Cardiovascular complication will be avoided Outcome: Progressing   Problem: Activity: Goal: Risk for activity intolerance will decrease Outcome: Progressing   Problem: Nutrition: Goal: Adequate nutrition will be maintained Outcome: Progressing   Problem: Coping: Goal: Level of anxiety will decrease Outcome: Progressing   Problem: Elimination: Goal: Will not experience complications related to bowel motility Outcome: Progressing Goal: Will not experience complications related to urinary retention Outcome: Progressing   Problem: Pain Managment: Goal: General experience of comfort will improve Outcome: Progressing   Problem: Safety: Goal: Ability to remain free from injury will improve Outcome: Progressing   Problem: Skin Integrity: Goal: Risk for impaired skin integrity will decrease Outcome: Progressing   Problem: Education: Goal: Knowledge of risk factors and measures for prevention of condition will improve Outcome: Progressing   Problem: Respiratory: Goal: Will maintain a patent airway Outcome: Progressing Goal: Complications related to the disease process, condition or treatment will be avoided or minimized Outcome: Progressing   

## 2019-04-28 NOTE — Progress Notes (Signed)
PROGRESS NOTE  Craig Foster  WUJ:[id number] DOB: 1990/11/01 DOA: 04/22/2019 PCP: Patient, No Pcp Per  Brief Narrative: Craig Foster is a 30 y.o. male with a history of CP, developmental delay, hypopituitarism, blindness, and seizure disorder who presented to [facility] ED 1/20 with fever and dyspnea. He had seizure activity with vomiting and aspiration as well as positive SARS-CoV-2 testing for which antibiotics, remdesivir, and steroids were started. He was admitted to [facility] ICU with acute hypoxic respiratory failure with septic shock due to aspiration pneumonia and covid-19 pneumonia. On 1/21 lactic acid cleared. On 1/23 pressors were weaned off and hypoxemia has shown significant improvement.   Assessment & Plan: Active Problems:   Respiratory failure (HCC)   Aspiration pneumonia of both lungs due to vomit (HCC)   Septic shock (HCC)   Hyponatremia  Acute hypoxemic respiratory failure due to covid-19 pneumonia and aspiration pneumonitis/pneumonia: SARS-CoV-2 PCR positive on 1/20 at [facility].  - Completed remdesivir x5 days 1/20 - 1/24 - Steroids given. Due to severe concomitant bacterial infection, return to 100% SpO2 on room air, and hypotension in hypopit patient, decadron changed back to hydrocortisone. - Continue antibiotics. Plan 7 days Tx with zosyn (last dose 1/27) - Continue airborne, contact precautions for 21 days from positive testing. - Maintain euvolemia/net negative.  - Avoid NSAIDs  - Dysphagia 3 diet  Anemia of critical/acute illness: All cell lines down with thrombocytopenia.  - Hgb only up 7.5 > 7.7g/dl with 1u PRBCs and persistent hypotension. No gross bleeding noted. Repeat 1u PRBCs (d/w mother) - CT abd/pelvis r/o hemorrhage. Empiric IV PPI, check FOBT - Will hold anticoagulation pending further work up  Hypotension: Care Everywhere notes/admissions reviewed. Though the patient does frequently have soft blood pressures they have not, during these visits, been documented to be as low  as the values we are seeing. There is no evidence of ongoing shock/sepsis/dehydration. I believe this makes evaluation of anemia more urgent.   Septic shock due to aspiration pneumonia, covid-19 pneumonia:  - Resolved shock, though remains hypotensive. Despite low BP, creatinine and mentation are at his baseline per parents. - DC'ed A-line - Keep PICC line RUE due to poor access  Seizure disorder, schizencephaly, hydrocephalus with VP shunt:  - Continue home keppra 1,500mg  po BID, carbamazepine 400mg  BID. Any inability to give these medications must be reported so IV doses can be given. - Continue prn lorazepam IV while in hospital.  Panhypopituitarism:  - Continue stress-dose hydrocortisone, changed to IV to avoid missed doses.   - Continue synthroid daily, can convert back to po.   Hyponatremia: Resolved - Continue monitoring.  ALT elevation: Mild and improving. - Monitor  Vomiting: Tolerating po, abdomen benign.   NAGMA: Resolved.  Spastic quadriplegic cerebral palsy:  - Has assistance for total care at home.  DVT prophylaxis: SCDs Code Status: Full Family Communication: Mother by phone this morning. Disposition Plan: Continue inpatient management including evaluation for occult hemorrhage with refractory anemia.   Consultants:   PCCM primary 1/20 - 1/23  Procedures:   Femoral CVL 1/20 - 1/22  Left radial arterial line DC'ed 1/24  RUE PICC 1/22 >>   Antimicrobials:  Vancomycin 1/20 - 1/22  Zosyn 1/20 - 1/26  Remdesivir 1/20 - 1/24  Subjective: No clinical changes per staff overnight. Taking pills by mouth.  Objective: Vitals:   04/28/19 0842 04/28/19 1214 04/28/19 1236 04/28/19 1249  BP: (!) 88/47 97/62  (!) 101/57  Pulse: 89 84  85  Resp: 19 18  18  Temp: 97.6 F (36.4 C) (!) 96 F (35.6 C)  97.6 F (36.4 C)  TempSrc: Axillary Axillary  Axillary  SpO2: 99% (!) 82% 93% 96%  Weight:        Intake/Output Summary (Last 24 hours) at  04/28/2019 1523 Last data filed at 04/28/2019 7824 Gross per 24 hour  Intake 792.5 ml  Output --  Net 792.5 ml   Filed Weights   04/25/19 0500 04/26/19 0439 04/28/19 0427  Weight: 64.2 kg 66.4 kg 64.3 kg   Gen: 29 y.o. male in no distress Pulm: Nonlabored breathing room air. Clear anterolaterally. CV: Regular rate and rhythm. No murmur, rub, or gallop. No JVD, no dependent edema. GI: Abdomen soft, non-tender, distended vs. voluntary guarding, with normoactive bowel sounds.  Ext: Warm, dry with stable LE contractures, arms held above head laying in bed Skin: No rashes, lesions or ulcers on visualized skin. No bleeding, rectal or otherwise. Neuro: Alert, pulls covers over his head, won't verbalize, legs remain contracted, stable. Psych: UTD  Data Reviewed: I have personally reviewed following labs and imaging studies  CBC: Recent Labs  Lab 04/22/19 1719 04/23/19 0417 04/27/19 0618 04/28/19 0426  WBC  --  31.8* 6.4 4.6  NEUTROABS  --  28.0* 2.3  --   HGB 11.6* 9.9* 7.5* 7.7*  HCT 34.0* 28.5* 22.4* 22.7*  MCV  --  86.1 87.2 88.7  PLT  --  212 130* 157   Basic Metabolic Panel: Recent Labs  Lab 04/22/19 1730 04/22/19 1730 04/23/19 0417 04/23/19 1830 04/24/19 1445 04/25/19 0420 04/26/19 0500 04/27/19 0618 04/28/19 0426  NA 140   < > 140   < > 136 139 138 138 139  K 3.4*   < > 3.7   < > 3.9 3.7 3.2* 2.4* 3.6  CL 111   < > 109   < > 108 106 107 106 110  CO2 13*   < > 15*   < > 19* 23 26 24 24   GLUCOSE 126*   < > 95   < > 101* 160* 133* 75 136*  BUN 14   < > 12   < > 12 18 21* 23* 20  CREATININE 0.97   < > 0.79   < > 0.49* 0.65 0.42* 0.65 0.56*  CALCIUM 8.1*   < > 7.8*   < > 7.6* 7.8* 7.3* 7.2* 7.3*  MG 1.3*  --  1.3*  --   --  1.9  --   --  2.6*  PHOS 3.3  --  2.5  --   --   --   --   --   --    < > = values in this interval not displayed.   GFR: CrCl cannot be calculated (Unknown ideal weight.). Liver Function Tests: Recent Labs  Lab 04/23/19 0417 04/24/19 0600  04/26/19 0500 04/27/19 0618 04/28/19 0426  AST 239* 96* 23 22 19   ALT 215* 133* 64* 46* 36  ALKPHOS 116 87 92 90 85  BILITOT 0.9 0.8 0.4 0.6 0.5  PROT 5.9* 5.1* 5.2* 4.9* 5.2*  ALBUMIN 3.0* 2.5* 2.5* 2.5* 2.5*   Recent Results (from the past 240 hour(s))  MRSA PCR Screening     Status: Abnormal   Collection Time: 04/22/19  6:00 PM   Specimen: Nasal Mucosa; Nasopharyngeal  Result Value Ref Range Status   MRSA by PCR POSITIVE (A) NEGATIVE Final    Comment:        The GeneXpert MRSA Assay (FDA  approved for NASAL specimens only), is one component of a comprehensive MRSA colonization surveillance program. It is not intended to diagnose MRSA infection nor to guide or monitor treatment for MRSA infections. CRITICAL RESULT CALLED TO, READ BACK BY AND VERIFIED WITH: RN RBV HOWARD AT [id number] 04/23/19 CRUICKSHANK A Performed at [facility], [place] [address], [place], [place] [address]       Radiology Studies: CT ABDOMEN PELVIS WO CONTRAST  Result Date: 04/28/2019 CLINICAL DATA:  Abdominal distension, unexplained anemia, hypotension EXAM: CT ABDOMEN AND PELVIS WITHOUT CONTRAST TECHNIQUE: Multidetector CT imaging of the abdomen and pelvis was performed following the standard protocol without IV contrast. COMPARISON:  None. FINDINGS: Lower chest: Dependent consolidations of the left lung, somewhat bandlike appearing (series 4, image 1) Hepatobiliary: No solid liver abnormality is seen. No gallstones, gallbladder wall thickening, or biliary dilatation. Pancreas: Unremarkable. No pancreatic ductal dilatation or surrounding inflammatory changes. Spleen: Normal in size without significant abnormality. Adrenals/Urinary Tract: Adrenal glands are unremarkable. Kidneys are normal, without renal calculi, solid lesion, or hydronephrosis. Bladder is unremarkable. Stomach/Bowel: Stomach is within normal limits. Appendix appears normal. No evidence of bowel wall thickening, distention, or  inflammatory changes. Vascular/Lymphatic: No significant vascular findings are present. No enlarged abdominal or pelvic lymph nodes. Reproductive: No mass or other significant abnormality. Other: No abdominal wall hernia or abnormality. Shunt catheter tubing is gently looped about the abdomen and pelvis, tip in the low right hemiabdomen (series 2, image 51). Trace free fluid in the dependent pelvis, in keeping with VP shunt drainage. Musculoskeletal: No acute or significant osseous findings. IMPRESSION: 1. No acute CT findings in the abdomen or pelvis to explain abdominal distension or anemia. No evidence of intra-abdominal or retroperitoneal hemorrhage. 2. Shunt catheter tubing is gently looped about the abdomen and pelvis, tip in the low right hemiabdomen. 3. Trace free fluid in the dependent pelvis, in keeping with VP shunt drainage. 4. Dependent consolidations of the left lung, somewhat bandlike appearing (series 4, image 1). Findings are consistent with atelectasis and/or scarring most likely related to aspiration, although some component of acute airspace disease may be present. Electronically Signed   By: Eddie Candle M.D.   On: 04/28/2019 11:43   CT HEAD W & WO CONTRAST  Result Date: 04/28/2019 CLINICAL DATA:  Headache.  Rule out hemorrhage.  VP shunt. EXAM: CT HEAD WITHOUT AND WITH CONTRAST TECHNIQUE: Contiguous axial images were obtained from the base of the skull through the vertex without and with intravenous contrast CONTRAST:  33mL OMNIPAQUE IOHEXOL 300 MG/ML  SOLN COMPARISON:  CT head 02/03/2019 FINDINGS: Brain: Negative for acute hemorrhage. Left frontal low-density subdural fluid collection similar in thickness approximately 9 mm. Progressive dural calcification since the prior study. Chronic dural calcification in the right temporal lobe is unchanged. 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 Shunt catheter is present in the cleft, unchanged in position.  Absence of the septum pellucidum. Ventricular size mildly prominent but stable from the prior study. Mild prominence of the temporal horns stable. Corpus callosum appears intact on sagittal images. No enhancing lesions postcontrast administration. No acute infarct. Vascular: Negative for hyperdense vessel. Developmental venous anomaly left cerebellum. Developmental venous anomaly left frontal lobe. Skull: No acute skeletal abnormality. Sinuses/Orbits: Paranasal sinuses clear.  No orbital lesion. Other: None IMPRESSION: Left frontal extra-axial subdural fluid collection is low-density and chronic. Progressive dural calcification in this area compared with the prior CT of 02/03/2019 No acute hemorrhage Schizencephaly with VP shunt catheter. Mild ventricular  prominence, stable from the prior study. Electronically Signed   By: Marlan Palau M.D.   On: 04/28/2019 11:32    Scheduled Meds: . carbamazepine  400 mg Oral BID  . chlorhexidine  15 mL Mouth Rinse BID  . Chlorhexidine Gluconate Cloth  6 each Topical Daily  . enoxaparin (LOVENOX) injection  40 mg Subcutaneous Q24H  . feeding supplement (ENSURE ENLIVE)  237 mL Oral TID BM  . hydrocortisone sodium succinate  50 mg Intravenous Q6H  . insulin aspart  0-9 Units Subcutaneous Q4H  . levothyroxine  175 mcg Oral Q0600  . mouth rinse  15 mL Mouth Rinse q12n4p  . mupirocin ointment   Nasal BID  . pantoprazole (PROTONIX) IV  40 mg Intravenous Q12H  . sodium chloride flush  10-40 mL Intracatheter Q12H   Continuous Infusions: . dextrose 5% lactated ringers 10 mL/hr at 04/26/19 0700  . levETIRAcetam 1,500 mg (04/28/19 0907)  . piperacillin-tazobactam (ZOSYN)  IV 3.375 g (04/28/19 1459)     LOS: 6 days   Time spent: 35 minutes.  Tyrone Nine, MD [facility] [email or web address] 04/28/2019, 3:23 PM

## 2019-04-29 LAB — TYPE AND SCREEN
ABO/RH(D): AB POS
Antibody Screen: NEGATIVE
Unit division: 0
Unit division: 0
Unit division: 0

## 2019-04-29 LAB — GLUCOSE, CAPILLARY
Glucose-Capillary: 102 mg/dL — ABNORMAL HIGH (ref 70–99)
Glucose-Capillary: 107 mg/dL — ABNORMAL HIGH (ref 70–99)
Glucose-Capillary: 141 mg/dL — ABNORMAL HIGH (ref 70–99)
Glucose-Capillary: 89 mg/dL (ref 70–99)
Glucose-Capillary: 97 mg/dL (ref 70–99)
Glucose-Capillary: 98 mg/dL (ref 70–99)

## 2019-04-29 LAB — CBC
HCT: 35.9 % — ABNORMAL LOW (ref 39.0–52.0)
Hemoglobin: 12.2 g/dL — ABNORMAL LOW (ref 13.0–17.0)
MCH: 30.2 pg (ref 26.0–34.0)
MCHC: 34 g/dL (ref 30.0–36.0)
MCV: 88.9 fL (ref 80.0–100.0)
Platelets: 175 10*3/uL (ref 150–400)
RBC: 4.04 MIL/uL — ABNORMAL LOW (ref 4.22–5.81)
RDW: 13.9 % (ref 11.5–15.5)
WBC: 6.6 10*3/uL (ref 4.0–10.5)
nRBC: 0 % (ref 0.0–0.2)

## 2019-04-29 LAB — BPAM RBC
Blood Product Expiration Date: 202102122359
Blood Product Expiration Date: 202102152359
Blood Product Expiration Date: 202102232359
ISSUE DATE / TIME: 202101260444
ISSUE DATE / TIME: 202101261018
Unit Type and Rh: 5100
Unit Type and Rh: 5100
Unit Type and Rh: 6200

## 2019-04-29 MED ORDER — PANTOPRAZOLE SODIUM 40 MG PO TBEC
40.0000 mg | DELAYED_RELEASE_TABLET | Freq: Two times a day (BID) | ORAL | Status: DC
  Administered 2019-04-29 – 2019-04-30 (×2): 40 mg via ORAL
  Filled 2019-04-29 (×2): qty 1

## 2019-04-29 MED ORDER — HYDROCORTISONE NA SUCCINATE PF 100 MG IJ SOLR
50.0000 mg | Freq: Two times a day (BID) | INTRAMUSCULAR | Status: DC
  Administered 2019-04-29 – 2019-04-30 (×2): 50 mg via INTRAVENOUS
  Filled 2019-04-29 (×2): qty 2

## 2019-04-29 MED ORDER — LEVETIRACETAM 500 MG PO TABS
1500.0000 mg | ORAL_TABLET | Freq: Two times a day (BID) | ORAL | Status: DC
  Administered 2019-04-29 – 2019-04-30 (×2): 1500 mg via ORAL
  Filled 2019-04-29 (×2): qty 3

## 2019-04-29 NOTE — Plan of Care (Addendum)
NO acute changes for patient overnight, bp remained stable, cbg required coverage 1x, remains on Room air, took meds as ordered, continue abx tx with last dose due today, HGB stable post 2u prbcs, continue plan of care Problem: Education: Goal: Knowledge of General Education information will improve Description: Including pain rating scale, medication(s)/side effects and non-pharmacologic comfort measures Outcome: Not Met (add Reason)   Problem: Health Behavior/Discharge Planning: Goal: Ability to manage health-related needs will improve Outcome: Not Met (add Reason)   Problem: Clinical Measurements: Goal: Ability to maintain clinical measurements within normal limits will improve Outcome: Progressing Goal: Will remain free from infection Outcome: Progressing Goal: Diagnostic test results will improve Outcome: Progressing Goal: Respiratory complications will improve Outcome: Progressing Goal: Cardiovascular complication will be avoided Outcome: Progressing   Problem: Activity: Goal: Risk for activity intolerance will decrease Outcome: Not Met (add Reason)   Problem: Nutrition: Goal: Adequate nutrition will be maintained Outcome: Progressing   Problem: Coping: Goal: Level of anxiety will decrease Outcome: Progressing   Problem: Elimination: Goal: Will not experience complications related to bowel motility Outcome: Completed/Met Goal: Will not experience complications related to urinary retention Outcome: Completed/Met   Problem: Pain Managment: Goal: General experience of comfort will improve Outcome: Progressing   Problem: Safety: Goal: Ability to remain free from injury will improve Outcome: Progressing   Problem: Skin Integrity: Goal: Risk for impaired skin integrity will decrease Outcome: Progressing   Problem: Education: Goal: Knowledge of risk factors and measures for prevention of condition will improve Outcome: Not Met (add Reason)

## 2019-04-29 NOTE — Progress Notes (Signed)
PHARMACIST - PHYSICIAN COMMUNICATION   CONCERNING: IV to Oral Route Change Policy  RECOMMENDATION: This patient is receiving keppra by the intravenous route.  Based on criteria approved by the Pharmacy and Therapeutics Committee, the intravenous medication(s) is/are being converted to the equivalent oral dose form(s).   DESCRIPTION: These criteria include:  The patient is eating (either orally or via tube) and/or has been taking other orally administered medications for a least 24 hours  The patient has no evidence of active gastrointestinal bleeding or impaired GI absorption (gastrectomy, short bowel, patient on TNA or NPO).  If you have questions about this conversion, please contact the Pharmacy Department  []   [phone] )  ( [phone] []   [phone] )  [facility] []   [phone] )  [facility] []   [phone] )  [facility] [x]   [phone] )  [facility]   ( [phone], [facility].D 04/29/2019 11:14 AM

## 2019-04-29 NOTE — Progress Notes (Signed)
[facility] PROGRESS NOTE    Progress Note  Craig Foster  QHU:[id number] DOB: 29-Dec-1990 DOA: 04/22/2019 PCP: Patient, No Pcp Per     Brief Narrative:   Craig Foster is an 29 y.o. male past medical history of development delay, hypopituitarism, blindness and seizure disorder presents to the ED on 04/22/2019 with fever and dyspnea SARS-CoV-2 test was positive she was started on remdesivir and steroids and admitted to [facility] for respiratory failure and septic shock due to aspiration pneumonia in the setting of COVID-19 infection.  Assessment/Plan:   Acute respiratory failure with hypoxia multifactorial in the setting of COVID-19 and aspiration pneumonia: Completed his course of IV remdesivir of 5 days.  Wean steroids down quickly. Patient is currently in satting greater than 94% on room air. Continue IV Zosyn for total of 7 days.  Resolved, blood pressure is currently stable despite low blood pressure creatinine and mentation remained at baseline. Keep euvolemic. Continue dysphagia 3 diet.  Symptomatic anemia: All cell lines are down, the patient is status post 2 unit of packed red blood cells due to persistent hypotension and gross GI bleed. CT abdomen and pelvis showed no active bleeding, currently on empiric IV PPI. Holding anticoagulation.  Hypotension/septic shock possibly due to aspiration pneumonia:: Creatinine and mentation are at baseline as per parents. The A-line has been DC'd continue PICC line due to poor access.  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 Lorazepam IV as needed for seizures.  Panhypopituitarism: Continue Synthroid and steroids, will try to wean off steroids quickly to his home dose of seven-point 5 AM and four-point 5 PM.  Hypernatremia: Likely hypovolemic resolved with IV fluid hydration.  Elevation in LFTs: Improving likely due to Covid.  Vomiting: Abdominal exam is benign, has resolved currently tolerating p.o.'s.   DVT prophylaxis: lovenox Family Communication:mother Disposition Plan/Barrier to D/C: home in am  Code Status:     Code Status Orders  (From admission, onward)         Start     Ordered   04/22/19 1749  Full code  Continuous     04/22/19 1748        Code Status History    This patient has a current code status but no historical code status.   Advance Care Planning Activity        IV Access:    Peripheral IV   Procedures and diagnostic studies:   CT ABDOMEN PELVIS WO CONTRAST  Result Date: 04/28/2019 CLINICAL DATA:  Abdominal distension, unexplained anemia, hypotension EXAM: CT ABDOMEN AND PELVIS WITHOUT CONTRAST TECHNIQUE: Multidetector CT imaging of the abdomen and pelvis was performed following the standard protocol without IV contrast. COMPARISON:  None. FINDINGS: Lower chest: Dependent consolidations of the left lung, somewhat bandlike appearing (series 4, image 1) Hepatobiliary: No solid liver abnormality is seen. No gallstones, gallbladder wall thickening, or biliary dilatation. Pancreas: Unremarkable. No pancreatic ductal dilatation or surrounding inflammatory changes. Spleen: Normal in size without significant abnormality. Adrenals/Urinary Tract: Adrenal glands are unremarkable. Kidneys are normal, without renal calculi, solid lesion, or hydronephrosis. Bladder is unremarkable. Stomach/Bowel: Stomach is within normal limits. Appendix appears normal. No evidence of bowel wall thickening, distention, or inflammatory changes. Vascular/Lymphatic: No significant vascular findings are present. No enlarged abdominal or pelvic lymph nodes. Reproductive: No mass or other significant abnormality. Other: No abdominal wall hernia or abnormality. Shunt catheter tubing is gently looped about the abdomen and pelvis, tip in the low right hemiabdomen (series 2, image 51). Trace free fluid  in the dependent pelvis, in keeping with VP shunt drainage. Musculoskeletal: No acute or significant  osseous findings. IMPRESSION: 1. No acute CT findings in the abdomen or pelvis to explain abdominal distension or anemia. No evidence of intra-abdominal or retroperitoneal hemorrhage. 2. Shunt catheter tubing is gently looped about the abdomen and pelvis, tip in the low right hemiabdomen. 3. Trace free fluid in the dependent pelvis, in keeping with VP shunt drainage. 4. Dependent consolidations of the left lung, somewhat bandlike appearing (series 4, image 1). Findings are consistent with atelectasis and/or scarring most likely related to aspiration, although some component of acute airspace disease may be present. Electronically Signed   By: Lauralyn Primes M.D.   On: 04/28/2019 11:43   CT HEAD W & WO CONTRAST  Result Date: 04/28/2019 CLINICAL DATA:  Headache.  Rule out hemorrhage.  VP shunt. EXAM: CT HEAD WITHOUT AND WITH CONTRAST TECHNIQUE: Contiguous axial images were obtained from the base of the skull through the vertex without and with intravenous contrast CONTRAST:  42mL OMNIPAQUE IOHEXOL 300 MG/ML  SOLN COMPARISON:  CT head 02/03/2019 FINDINGS: Brain: Negative for acute hemorrhage. Left frontal low-density subdural fluid collection similar in thickness approximately 9 mm. Progressive dural calcification since the prior study. Chronic dural calcification in the right temporal lobe is unchanged. 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 Shunt catheter is present in the cleft, unchanged in position. Absence of the septum pellucidum. Ventricular size mildly prominent but stable from the prior study. Mild prominence of the temporal horns stable. Corpus callosum appears intact on sagittal images. No enhancing lesions postcontrast administration. No acute infarct. Vascular: Negative for hyperdense vessel. Developmental venous anomaly left cerebellum. Developmental venous anomaly left frontal lobe. Skull: No acute skeletal abnormality. Sinuses/Orbits: Paranasal sinuses clear.  No  orbital lesion. Other: None IMPRESSION: Left frontal extra-axial subdural fluid collection is low-density and chronic. Progressive dural calcification in this area compared with the prior CT of 02/03/2019 No acute hemorrhage Schizencephaly with VP shunt catheter. Mild ventricular prominence, stable from the prior study. Electronically Signed   By: Marlan Palau M.D.   On: 04/28/2019 11:32     Medical Consultants:    None.  Anti-Infectives:     Subjective:    Afnan Aime nonverbal  Objective:    Vitals:   04/28/19 2054 04/28/19 2341 04/29/19 0345 04/29/19 0645  BP: (!) 87/47 (!) 84/57 107/69   Pulse: 68 83 (!) 57   Resp: (!) 27 20 15    Temp: 98 F (36.7 C) 98.2 F (36.8 C) 97.8 F (36.6 C)   TempSrc: Axillary Axillary Axillary   SpO2: 96% 97% 95%   Weight:    62.6 kg   SpO2: 95 % O2 Flow Rate (L/min): 2 L/min FiO2 (%): 40 %   Intake/Output Summary (Last 24 hours) at 04/29/2019 0732 Last data filed at 04/29/2019 0510 Gross per 24 hour  Intake 1178.46 ml  Output 350 ml  Net 828.46 ml   Filed Weights   04/26/19 0439 04/28/19 0427 04/29/19 0645  Weight: 66.4 kg 64.3 kg 62.6 kg    Exam: General exam: In no acute distress. Respiratory system: Good air movement and clear to auscultation. Cardiovascular system: S1 & S2 heard, RRR.  Gastrointestinal system: Abdomen is nondistended, soft and nontender.  Extremities: No pedal edema. Skin: No rashes, lesions or ulcers  Data Reviewed:    Labs: Basic Metabolic Panel: Recent Labs  Lab 04/22/19 1730 04/22/19 1730 04/23/19 0417 04/23/19 1830 04/24/19 1445 04/24/19 1445  04/25/19 0420 04/25/19 0420 04/26/19 0500 04/26/19 0500 04/27/19 0618 04/28/19 0426  NA 140   < > 140   < > 136  --  139  --  138  --  138 139  K 3.4*   < > 3.7   < > 3.9   < > 3.7   < > 3.2*   < > 2.4* 3.6  CL 111   < > 109   < > 108  --  106  --  107  --  106 110  CO2 13*   < > 15*   < > 19*  --  23  --  26  --  24 24  GLUCOSE 126*   < >  95   < > 101*  --  160*  --  133*  --  75 136*  BUN 14   < > 12   < > 12  --  18  --  21*  --  23* 20  CREATININE 0.97   < > 0.79   < > 0.49*  --  0.65  --  0.42*  --  0.65 0.56*  CALCIUM 8.1*   < > 7.8*   < > 7.6*  --  7.8*  --  7.3*  --  7.2* 7.3*  MG 1.3*  --  1.3*  --   --   --  1.9  --   --   --   --  2.6*  PHOS 3.3  --  2.5  --   --   --   --   --   --   --   --   --    < > = values in this interval not displayed.   GFR CrCl cannot be calculated (Unknown ideal weight.). Liver Function Tests: Recent Labs  Lab 04/23/19 0417 04/24/19 0600 04/26/19 0500 04/27/19 0618 04/28/19 0426  AST 239* 96* 23 22 19   ALT 215* 133* 64* 46* 36  ALKPHOS 116 87 92 90 85  BILITOT 0.9 0.8 0.4 0.6 0.5  PROT 5.9* 5.1* 5.2* 4.9* 5.2*  ALBUMIN 3.0* 2.5* 2.5* 2.5* 2.5*   No results for input(s): LIPASE, AMYLASE in the last 168 hours. No results for input(s): AMMONIA in the last 168 hours. Coagulation profile No results for input(s): INR, PROTIME in the last 168 hours. COVID-19 Labs  Recent Labs    04/27/19 0618  DDIMER 3.25*  CRP 4.9*    No results found for: SARSCOV2NAA  CBC: Recent Labs  Lab 04/22/19 1719 04/23/19 0417 04/27/19 0618 04/28/19 0426 04/29/19 0400  WBC  --  31.8* 6.4 4.6 6.6  NEUTROABS  --  28.0* 2.3  --   --   HGB 11.6* 9.9* 7.5* 7.7* 12.2*  HCT 34.0* 28.5* 22.4* 22.7* 35.9*  MCV  --  86.1 87.2 88.7 88.9  PLT  --  212 130* 157 175   Cardiac Enzymes: No results for input(s): CKTOTAL, CKMB, CKMBINDEX, TROPONINI in the last 168 hours. BNP (last 3 results) No results for input(s): PROBNP in the last 8760 hours. CBG: Recent Labs  Lab 04/28/19 1604 04/28/19 1934 04/28/19 2340 04/29/19 0347 04/29/19 0717  GLUCAP 101* 106* 141* 107* 98   D-Dimer: Recent Labs    04/27/19 0618  DDIMER 3.25*   Hgb A1c: No results for input(s): HGBA1C in the last 72 hours. Lipid Profile: No results for input(s): CHOL, HDL, LDLCALC, TRIG, CHOLHDL, LDLDIRECT in the last 72  hours. Thyroid function studies: No  results for input(s): TSH, T4TOTAL, T3FREE, THYROIDAB in the last 72 hours.  Invalid input(s): FREET3 Anemia work up: No results for input(s): VITAMINB12, FOLATE, FERRITIN, TIBC, IRON, RETICCTPCT in the last 72 hours. Sepsis Labs: Recent Labs  Lab 04/22/19 1730 04/23/19 0417 04/23/19 1830 04/27/19 0618 04/28/19 0426 04/29/19 0400  PROCALCITON >150.00  --   --   --   --   --   WBC  --  31.8*  --  6.4 4.6 6.6  LATICACIDVEN  --  5.3* 1.7  --   --   --    Microbiology Recent Results (from the past 240 hour(s))  MRSA PCR Screening     Status: Abnormal   Collection Time: 04/22/19  6:00 PM   Specimen: Nasal Mucosa; Nasopharyngeal  Result Value Ref Range Status   MRSA by PCR POSITIVE (A) NEGATIVE Final    Comment:        The GeneXpert MRSA Assay (FDA approved for NASAL specimens only), is one component of a comprehensive MRSA colonization surveillance program. It is not intended to diagnose MRSA infection nor to guide or monitor treatment for MRSA infections. CRITICAL RESULT CALLED TO, READ BACK BY AND VERIFIED WITH: RN RBV HOWARD AT 0277 04/23/19 CRUICKSHANK A Performed at [facility], Guadalupe Guerra [address] [place], [place] [address]      Medications:   . carbamazepine  400 mg Oral BID  . chlorhexidine  15 mL Mouth Rinse BID  . Chlorhexidine Gluconate Cloth  6 each Topical Daily  . feeding supplement (ENSURE ENLIVE)  237 mL Oral TID BM  . hydrocortisone sodium succinate  50 mg Intravenous Q6H  . insulin aspart  0-9 Units Subcutaneous Q4H  . levothyroxine  175 mcg Oral Q0600  . mouth rinse  15 mL Mouth Rinse q12n4p  . mupirocin ointment   Nasal BID  . pantoprazole (PROTONIX) IV  40 mg Intravenous Q12H  . sodium chloride flush  10-40 mL Intracatheter Q12H   Continuous Infusions: . dextrose 5% lactated ringers 10 mL/hr at 04/26/19 0700  . levETIRAcetam 1,500 mg (04/28/19 2043)  . piperacillin-tazobactam (ZOSYN)  IV  3.375 g (04/29/19 0510)      LOS: 7 days   Charlynne Cousins  [facility]  04/29/2019, 7:32 AM

## 2019-04-29 NOTE — Progress Notes (Signed)
 Nutrition Follow-up  DOCUMENTATION CODES:   Not applicable  INTERVENTION:   Increase Magic cup TID with meals, each supplement provides 290 kcal and 9 grams of protein  Continue Ensure Enlive po TID, each supplement provides 350 kcal and 20 grams of protein  Feeding assistance and encouragement at meal times  NUTRITION DIAGNOSIS:   Inadequate oral intake related to acute illness, catabolic illness as evidenced by NPO status.  Being addressed via supplements  GOAL:   Patient will meet greater than or equal to 90% of their needs  Progressing   MONITOR:   PO intake, Supplement acceptance, Labs, Weight trends  REASON FOR ASSESSMENT:   Consult, Ventilator Enteral/tube feeding initiation and management  ASSESSMENT:   29 yo male admitted with seizures, aspiration post vomiting, COVID+.  PMH includes CP, seizure disorder, developmental delay, blind. Pt is mostly non-verbal at baseline, uses hand gestures to communicate  1/20 Admit, Transfer from [facility] to [facility] 1/22 Diet advanced to Dysphagia 3  Recorded po intake 0-25% of meals. Pt ate 15% at breakfast this AM, 25% at dinner. RN reports pt eating Magic Cups; drank 1/2 Ensure Enlive this AM  Weight relatively stable since admit; current wt 62.6 kg.  Labs: reviewed Meds: reviewed   Diet Order:   Diet Order            DIET DYS 3 Room service appropriate? Yes; Fluid consistency: Thin  Diet effective now              EDUCATION NEEDS:   Not appropriate for education at this time  Skin:  Skin Assessment: Reviewed RN Assessment  Last BM:  1/26  Height:   Ht Readings from Last 1 Encounters:  No data found for Ht    Weight:   Wt Readings from Last 1 Encounters:  04/29/19 62.6 kg    Ideal Body Weight:   unable to assess  BMI:  There is no height or weight on file to calculate BMI.  Estimated Nutritional Needs:   Kcal:  4536-4680 kcals  Protein:  90-105 g  Fluid:  >/= 1.8 L   Camree Wigington MS,  RDN, LDN, CNSC [phone] Pager  [phone] Weekend/On-Call Pager

## 2019-04-30 LAB — GLUCOSE, CAPILLARY
Glucose-Capillary: 105 mg/dL — ABNORMAL HIGH (ref 70–99)
Glucose-Capillary: 78 mg/dL (ref 70–99)
Glucose-Capillary: 78 mg/dL (ref 70–99)
Glucose-Capillary: 90 mg/dL (ref 70–99)
Glucose-Capillary: 94 mg/dL (ref 70–99)

## 2019-04-30 MED ORDER — HYDROCORTISONE 10 MG PO TABS: 10.0000 mg | ORAL_TABLET | Freq: Two times a day (BID) | ORAL | 0 refills | Status: AC

## 2019-04-30 MED ORDER — HYDROCORTISONE 5 MG PO TABS: 7.5000 mg | ORAL_TABLET | Freq: Two times a day (BID) | ORAL | Status: DC

## 2019-04-30 MED ORDER — HYDROCORTISONE 5 MG PO TABS: 7.5000 mg | ORAL_TABLET | Freq: Two times a day (BID) | ORAL | Status: AC

## 2019-04-30 MED ORDER — GUAIFENESIN-DM 100-10 MG/5ML PO SYRP
5.0000 mL | ORAL_SOLUTION | ORAL | Status: DC | PRN
  Administered 2019-04-30: 03:00:00 5 mL via ORAL
  Filled 2019-04-30: qty 10

## 2019-04-30 MED ORDER — HYDROCORTISONE 10 MG PO TABS
10.0000 mg | ORAL_TABLET | Freq: Two times a day (BID) | ORAL | Status: DC
  Administered 2019-04-30: 09:00:00 10 mg via ORAL
  Filled 2019-04-30 (×3): qty 1

## 2019-04-30 NOTE — Discharge Instructions (Signed)
COVID-19 COVID-19 is a respiratory infection that is caused by a virus called severe acute respiratory syndrome coronavirus 2 (SARS-CoV-2). The disease is also known as coronavirus disease or novel coronavirus. In some people, the virus may not cause any symptoms. In others, it may cause a serious infection. The infection can get worse quickly and can lead to complications, such as:  Pneumonia, or infection of the lungs.  Acute respiratory distress syndrome or ARDS. This is a condition in which fluid build-up in the lungs prevents the lungs from filling with air and passing oxygen into the blood.  Acute respiratory failure. This is a condition in which there is not enough oxygen passing from the lungs to the body or when carbon dioxide is not passing from the lungs out of the body.  Sepsis or septic shock. This is a serious bodily reaction to an infection.  Blood clotting problems.  Secondary infections due to bacteria or fungus.  Organ failure. This is when your body's organs stop working. The virus that causes COVID-19 is contagious. This means that it can spread from person to person through droplets from coughs and sneezes (respiratory secretions). What are the causes? This illness is caused by a virus. You may catch the virus by:  Breathing in droplets from an infected person. Droplets can be spread by a person breathing, speaking, singing, coughing, or sneezing.  Touching something, like a table or a doorknob, that was exposed to the virus (contaminated) and then touching your mouth, nose, or eyes. What increases the risk? Risk for infection You are more likely to be infected with this virus if you:  Are within 6 feet (2 meters) of a person with COVID-19.  Provide care for or live with a person who is infected with COVID-19.  Spend time in crowded indoor spaces or live in shared housing. Risk for serious illness You are more likely to become seriously ill from the virus if you:   Are 50 years of age or older. The higher your age, the more you are at risk for serious illness.  Live in a nursing home or long-term care facility.  Have cancer.  Have a long-term (chronic) disease such as: ? Chronic lung disease, including chronic obstructive pulmonary disease or asthma. ? A long-term disease that lowers your body's ability to fight infection (immunocompromised). ? Heart disease, including heart failure, a condition in which the arteries that lead to the heart become narrow or blocked (coronary artery disease), a disease which makes the heart muscle thick, weak, or stiff (cardiomyopathy). ? Diabetes. ? Chronic kidney disease. ? Sickle cell disease, a condition in which red blood cells have an abnormal "sickle" shape. ? Liver disease.  Are obese. What are the signs or symptoms? Symptoms of this condition can range from mild to severe. Symptoms may appear any time from 2 to 14 days after being exposed to the virus. They include:  A fever or chills.  A cough.  Difficulty breathing.  Headaches, body aches, or muscle aches.  Runny or stuffy (congested) nose.  A sore throat.  New loss of taste or smell. Some people may also have stomach problems, such as nausea, vomiting, or diarrhea. Other people may not have any symptoms of COVID-19. How is this diagnosed? This condition may be diagnosed based on:  Your signs and symptoms, especially if: ? You live in an area with a COVID-19 outbreak. ? You recently traveled to or from an area where the virus is common. ? You   provide care for or live with a person who was diagnosed with COVID-19. ? You were exposed to a person who was diagnosed with COVID-19.  A physical exam.  Lab tests, which may include: ? Taking a sample of fluid from the back of your nose and throat (nasopharyngeal fluid), your nose, or your throat using a swab. ? A sample of mucus from your lungs (sputum). ? Blood tests.  Imaging tests, which  may include, X-rays, CT scan, or ultrasound. How is this treated? At present, there is no medicine to treat COVID-19. Medicines that treat other diseases are being used on a trial basis to see if they are effective against COVID-19. Your health care provider will talk with you about ways to treat your symptoms. For most people, the infection is mild and can be managed at home with rest, fluids, and over-the-counter medicines. Treatment for a serious infection usually takes places in a hospital intensive care unit (ICU). It may include one or more of the following treatments. These treatments are given until your symptoms improve.  Receiving fluids and medicines through an IV.  Supplemental oxygen. Extra oxygen is given through a tube in the nose, a face mask, or a hood.  Positioning you to lie on your stomach (prone position). This makes it easier for oxygen to get into the lungs.  Continuous positive airway pressure (CPAP) or bi-level positive airway pressure (BPAP) machine. This treatment uses mild air pressure to keep the airways open. A tube that is connected to a motor delivers oxygen to the body.  Ventilator. This treatment moves air into and out of the lungs by using a tube that is placed in your windpipe.  Tracheostomy. This is a procedure to create a hole in the neck so that a breathing tube can be inserted.  Extracorporeal membrane oxygenation (ECMO). This procedure gives the lungs a chance to recover by taking over the functions of the heart and lungs. It supplies oxygen to the body and removes carbon dioxide. Follow these instructions at home: Lifestyle  If you are sick, stay home except to get medical care. Your health care provider will tell you how long to stay home. Call your health care provider before you go for medical care.  Rest at home as told by your health care provider.  Do not use any products that contain nicotine or tobacco, such as cigarettes, e-cigarettes, and  chewing tobacco. If you need help quitting, ask your health care provider.  Return to your normal activities as told by your health care provider. Ask your health care provider what activities are safe for you. General instructions  Take over-the-counter and prescription medicines only as told by your health care provider.  Drink enough fluid to keep your urine pale yellow.  Keep all follow-up visits as told by your health care provider. This is important. How is this prevented?  There is no vaccine to help prevent COVID-19 infection. However, there are steps you can take to protect yourself and others from this virus. To protect yourself:   Do not travel to areas where COVID-19 is a risk. The areas where COVID-19 is reported change often. To identify high-risk areas and travel restrictions, check the CDC travel website: wwwnc.cdc.gov/travel/notices  If you live in, or must travel to, an area where COVID-19 is a risk, take precautions to avoid infection. ? Stay away from people who are sick. ? Wash your hands often with soap and water for 20 seconds. If soap and water   are not available, use an alcohol-based hand sanitizer. ? Avoid touching your mouth, face, eyes, or nose. ? Avoid going out in public, follow guidance from your state and local health authorities. ? If you must go out in public, wear a cloth face covering or face mask. Make sure your mask covers your nose and mouth. ? Avoid crowded indoor spaces. Stay at least 6 feet (2 meters) away from others. ? Disinfect objects and surfaces that are frequently touched every day. This may include:  Counters and tables.  Doorknobs and light switches.  Sinks and faucets.  Electronics, such as phones, remote controls, keyboards, computers, and tablets. To protect others: If you have symptoms of COVID-19, take steps to prevent the virus from spreading to others.  If you think you have a COVID-19 infection, contact your health care  provider right away. Tell your health care team that you think you may have a COVID-19 infection.  Stay home. Leave your house only to seek medical care. Do not use public transport.  Do not travel while you are sick.  Wash your hands often with soap and water for 20 seconds. If soap and water are not available, use alcohol-based hand sanitizer.  Stay away from other members of your household. Let healthy household members care for children and pets, if possible. If you have to care for children or pets, wash your hands often and wear a mask. If possible, stay in your own room, separate from others. Use a different bathroom.  Make sure that all people in your household wash their hands well and often.  Cough or sneeze into a tissue or your sleeve or elbow. Do not cough or sneeze into your hand or into the air.  Wear a cloth face covering or face mask. Make sure your mask covers your nose and mouth. Where to find more information  Centers for Disease Control and Prevention: www.cdc.gov/coronavirus/2019-ncov/index.html  World Health Organization: www.who.int/health-topics/coronavirus Contact a health care provider if:  You live in or have traveled to an area where COVID-19 is a risk and you have symptoms of the infection.  You have had contact with someone who has COVID-19 and you have symptoms of the infection. Get help right away if:  You have trouble breathing.  You have pain or pressure in your chest.  You have confusion.  You have bluish lips and fingernails.  You have difficulty waking from sleep.  You have symptoms that get worse. These symptoms may represent a serious problem that is an emergency. Do not wait to see if the symptoms will go away. Get medical help right away. Call your local emergency services (911 in the [place] Do not drive yourself to the hospital. Let the emergency medical personnel know if you think you have COVID-19. Summary  COVID-19 is a  respiratory infection that is caused by a virus. It is also known as coronavirus disease or novel coronavirus. It can cause serious infections, such as pneumonia, acute respiratory distress syndrome, acute respiratory failure, or sepsis.  The virus that causes COVID-19 is contagious. This means that it can spread from person to person through droplets from breathing, speaking, singing, coughing, or sneezing.  You are more likely to develop a serious illness if you are 50 years of age or older, have a weak immune system, live in a nursing home, or have chronic disease.  There is no medicine to treat COVID-19. Your health care provider will talk with you about ways to treat your symptoms.    Take steps to protect yourself and others from infection. Wash your hands often and disinfect objects and surfaces that are frequently touched every day. Stay away from people who are sick and wear a mask if you are sick. This information is not intended to replace advice given to you by your health care provider. Make sure you discuss any questions you have with your health care provider. Document Revised: 01/16/2019 Document Reviewed: 04/24/2018 [employer] Patient Education  2020 [employer]  

## 2019-04-30 NOTE — Progress Notes (Signed)
Patient makes noises, no comprehensible words.  Patient feed a full vanilla pudding tonight, mother states that he prefers vanilla over chocolate and does not care for applesauce usually.  Pt tuns head to side and makes a frown face when it seems he does not want anymore of something.  Patient responds well to positive and happy talking to him.  Bed change and bed bath due to leaking external catheter.

## 2019-04-30 NOTE — Discharge Summary (Signed)
Physician Discharge Summary  Craig Foster FSE:[id number] DOB: 21-Jun-1990 DOA: 04/22/2019  PCP: Patient, No Pcp Per  Admit date: 04/22/2019 Discharge date: 04/30/2019  Admitted From: Home Disposition:  Home  Recommendations for Outpatient Follow-up:  1. Follow up with PCP in 1-2 weeks, needs to be referred to gastroenterologist for possible unclear source GI bleed. 2. Please obtain BMP/CBC in one week   Home Health:Yes Equipment/Devices:None  Discharge Condition:Stable CODE STATUS:Full Diet recommendation: Heart Healthy  Brief/Interim Summary: 29 y.o. male past medical history of development delay, hypopituitarism, blindness and seizure disorder presents to the ED on 04/22/2019 with fever and dyspnea SARS-CoV-2 test was positive she was started on remdesivir and steroids and admitted to [facility] for respiratory failure and septic shock due to aspiration pneumonia in the setting of COVID-19 infection  Discharge Diagnoses:  Active Problems:   Acute respiratory failure with hypoxia (HCC)   Aspiration pneumonia of both lungs due to vomit (HCC)   Septic shock (HCC)   Hyponatremia Acute respiratory failure with hypoxia multifactorial in the setting of aspiration pneumonia and COVID-19 infection: He was started on IV remdesivir and steroids he completed his course of IV remdesivir, will continue to taper his steroids as an outpatient. He was also started on IV Zosyn and completed 7-day course of antibiotics for possible aspiration pneumonia. Speech was consulted and recommended a dysphagia diet.  Symptomatic anemia: He is status post 2 units of packed red blood cells which were given due to hemoglobin less than 7 and persistent hypotension, CT scan of the abdomen pelvis showed no active bleeding he will be sent home on a PPI. His anticoagulation was discontinued. He will need to follow-up with GI as an outpatient for further work-up. He will need 21 days of isolation.  Hypotension/septic shock  possibly due to aspiration pneumonia: He was started empirically on IV antibiotics, was temporarily on pressors culture data remain negative. Complete his course in house.  Seizure disorder: No changes made to his medication.  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  Elevated LFTs: Likely due to COVID-19.  Vomiting:  Abdominal exam was benign imaging showed no acute findings, after symptomatic treatment he was tolerating a dysphagia 3 diet.  Discharge Instructions  Discharge Instructions    Diet - low sodium heart healthy   Complete by: As directed    Increase activity slowly   Complete by: As directed      Allergies as of 04/30/2019      Reactions   Azithromycin Other (See Comments)   Contraindicated with his tegretol, causes tegretol toxicity.    Erythromycin Other (See Comments)   Causes tegretol toxicity   Risperidone And Related Nausea And Vomiting, Other (See Comments)   Other reaction(s): Nausea And Vomiting Severe pain   Tape Rash      Medication List    TAKE these medications   acetaminophen 500 MG tablet Commonly known as: TYLENOL Take 1,000 mg by mouth every 6 (six) hours as needed for mild pain.   carbamazepine 100 MG chewable tablet Commonly known as: TEGRETOL Chew 400 mg by mouth 2 (two) times daily.   diazepam 10 MG Gel Commonly known as: DIASTAT ACUDIAL Place 10 mg rectally once as needed for seizure.   Flintstones Complete 18 MG Chew Chew 1 tablet by mouth at bedtime.   hydrocortisone 10 MG tablet Commonly known  as: CORTEF Take 1 tablet (10 mg total) by mouth 2 (two) times daily for 3 days. What changed: You were already taking a medication with the same name, and this prescription was added. Make sure you understand how and when to take each.   hydrocortisone 5 MG tablet Commonly known  as: CORTEF Take 1.5 tablets (7.5 mg total) by mouth 2 (two) times daily. May increase to 4.5 tablets twice daily if under distress. Start taking on: May 04, 2019 What changed: These instructions start on May 04, 2019. If you are unsure what to do until then, ask your doctor or other care provider.   ibuprofen 200 MG tablet Commonly known as: ADVIL Take 400 mg by mouth every 6 (six) hours as needed for moderate pain.   Keppra 500 MG tablet Generic drug: levETIRAcetam Take 1,500 mg by mouth 2 (two) times daily.   levothyroxine 175 MCG tablet Commonly known as: SYNTHROID Take 175 mcg by mouth daily.   LORazepam 0.5 MG tablet Commonly known as: ATIVAN Take 0.5 mg by mouth every 6 (six) hours as needed for anxiety.   polyethylene glycol powder 17 GM/SCOOP powder Commonly known as: GLYCOLAX/MIRALAX Take 17 g by mouth daily.       Allergies  Allergen Reactions  . Azithromycin Other (See Comments)    Contraindicated with his tegretol, causes tegretol toxicity.    . Erythromycin Other (See Comments)    Causes tegretol toxicity  . Risperidone And Related Nausea And Vomiting and Other (See Comments)    Other reaction(s): Nausea And Vomiting Severe pain   . Tape Rash    Consultations:  None.   Procedures/Studies: CT ABDOMEN PELVIS WO CONTRAST  Result Date: 04/28/2019 CLINICAL DATA:  Abdominal distension, unexplained anemia, hypotension EXAM: CT ABDOMEN AND PELVIS WITHOUT CONTRAST TECHNIQUE: Multidetector CT imaging of the abdomen and pelvis was performed following the standard protocol without IV contrast. COMPARISON:  None. FINDINGS: Lower chest: Dependent consolidations of the left lung, somewhat bandlike appearing (series 4, image 1) Hepatobiliary: No solid liver abnormality is seen. No gallstones, gallbladder wall thickening, or biliary dilatation. Pancreas: Unremarkable. No pancreatic ductal dilatation or surrounding inflammatory changes. Spleen: Normal in size  without significant abnormality. Adrenals/Urinary Tract: Adrenal glands are unremarkable. Kidneys are normal, without renal calculi, solid lesion, or hydronephrosis. Bladder is unremarkable. Stomach/Bowel: Stomach is within normal limits. Appendix appears normal. No evidence of bowel wall thickening, distention, or inflammatory changes. Vascular/Lymphatic: No significant vascular findings are present. No enlarged abdominal or pelvic lymph nodes. Reproductive: No mass or other significant abnormality. Other: No abdominal wall hernia or abnormality. Shunt catheter tubing is gently looped about the abdomen and pelvis, tip in the low right hemiabdomen (series 2, image 51). Trace free fluid in the dependent pelvis, in keeping with VP shunt drainage. Musculoskeletal: No acute or significant osseous findings. IMPRESSION: 1. No acute CT findings in the abdomen or pelvis to explain abdominal distension or anemia. No evidence of intra-abdominal or retroperitoneal hemorrhage. 2. Shunt catheter tubing is gently looped about the abdomen and pelvis, tip in the low right hemiabdomen. 3. Trace free fluid in the dependent pelvis, in keeping with VP shunt drainage. 4. Dependent consolidations of the left lung, somewhat bandlike appearing (series 4, image 1). Findings are consistent with atelectasis and/or scarring most likely related to aspiration, although some component of acute airspace disease may be present. Electronically Signed   By: Eddie Candle M.D.   On: 04/28/2019 11:43   CT HEAD W & WO CONTRAST  Result  Date: 04/28/2019 CLINICAL DATA:  Headache.  Rule out hemorrhage.  VP shunt. EXAM: CT HEAD WITHOUT AND WITH CONTRAST TECHNIQUE: Contiguous axial images were obtained from the base of the skull through the vertex without and with intravenous contrast CONTRAST:  42mL OMNIPAQUE IOHEXOL 300 MG/ML  SOLN COMPARISON:  CT head 02/03/2019 FINDINGS: Brain: Negative for acute hemorrhage. Left frontal low-density subdural fluid  collection similar in thickness approximately 9 mm. Progressive dural calcification since the prior study. Chronic dural calcification in the right temporal lobe is unchanged. 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 Shunt catheter is present in the cleft, unchanged in position. Absence of the septum pellucidum. Ventricular size mildly prominent but stable from the prior study. Mild prominence of the temporal horns stable. Corpus callosum appears intact on sagittal images. No enhancing lesions postcontrast administration. No acute infarct. Vascular: Negative for hyperdense vessel. Developmental venous anomaly left cerebellum. Developmental venous anomaly left frontal lobe. Skull: No acute skeletal abnormality. Sinuses/Orbits: Paranasal sinuses clear.  No orbital lesion. Other: None IMPRESSION: Left frontal extra-axial subdural fluid collection is low-density and chronic. Progressive dural calcification in this area compared with the prior CT of 02/03/2019 No acute hemorrhage Schizencephaly with VP shunt catheter. Mild ventricular prominence, stable from the prior study. Electronically Signed   By: Marlan Palau M.D.   On: 04/28/2019 11:32   Portable chest 1 View  Result Date: 04/23/2019 CLINICAL DATA:  Respiratory failure. EXAM: PORTABLE CHEST 1 VIEW COMPARISON:  04/22/2019 FINDINGS: Patient is slightly rotated to the left. Lungs are hypoinflated with patchy airspace process over the left lung likely pneumonia with mild improved aeration in the left base. No effusion. Cardiomediastinal silhouette and remainder of the exam is unchanged. IMPRESSION: Patchy airspace process over the left lung likely multifocal infection with mild improved aeration in the left base. Electronically Signed   By: Elberta Fortis M.D.   On: 04/23/2019 08:19   DG Chest Port 1 View  Result Date: 04/22/2019 CLINICAL DATA:  29 year old male with respiratory failure. EXAM: PORTABLE CHEST 1 VIEW  COMPARISON:  Chest radiograph dated 04/22/2019. FINDINGS: Airspace opacity involving the left lung similar or worsened since the earlier radiograph. The right lung remains clear. No large pleural effusion or pneumothorax. Stable cardiac silhouette. No acute osseous pathology. There is gaseous distention of the stomach. IMPRESSION: Interval increase in the left lung opacity since the earlier radiograph. Clinical correlation and follow-up recommended. Electronically Signed   By: Elgie Collard M.D.   On: 04/22/2019 18:00   [place] EKG SITE RITE  Result Date: 04/24/2019 If Site Rite image not attached, placement could not be confirmed due to current cardiac rhythm.      Subjective: Nonverbal  Discharge Exam: Vitals:   04/29/19 1933 04/30/19 0419  BP:    Pulse: 72   Resp: 18   Temp: 98.5 F (36.9 C) 98.2 F (36.8 C)  SpO2: 97%    Vitals:   04/29/19 1553 04/29/19 1933 04/30/19 0419 04/30/19 0500  BP: 104/61     Pulse: 71 72    Resp: 19 18    Temp: 97.8 F (36.6 C) 98.5 F (36.9 C) 98.2 F (36.8 C)   TempSrc: Axillary Axillary Axillary   SpO2: 100% 97%    Weight:    61.1 kg    General: Pt is alert, awake, not in acute distress Cardiovascular: RRR, S1/S2 +, no rubs, no gallops Respiratory: CTA bilaterally, no wheezing, no rhonchi Abdominal: Soft, NT, ND, bowel sounds + Extremities: no edema,  no cyanosis    The results of significant diagnostics from this hospitalization (including imaging, microbiology, ancillary and laboratory) are listed below for reference.     Microbiology: Recent Results (from the past 240 hour(s))  MRSA PCR Screening     Status: Abnormal   Collection Time: 04/22/19  6:00 PM   Specimen: Nasal Mucosa; Nasopharyngeal  Result Value Ref Range Status   MRSA by PCR POSITIVE (A) NEGATIVE Final    Comment:        The GeneXpert MRSA Assay (FDA approved for NASAL specimens only), is one component of a comprehensive MRSA colonization surveillance program.  It is not intended to diagnose MRSA infection nor to guide or monitor treatment for MRSA infections. CRITICAL RESULT CALLED TO, READ BACK BY AND VERIFIED WITH: RN RBV HOWARD AT [id number] 04/23/19 CRUICKSHANK A Performed at [facility], [address] [place], [place] [address]      Labs: BNP (last 3 results) No results for input(s): BNP in the last 8760 hours. Basic Metabolic Panel: Recent Labs  Lab 04/24/19 1445 04/25/19 0420 04/26/19 0500 04/27/19 0618 04/28/19 0426  NA 136 139 138 138 139  K 3.9 3.7 3.2* 2.4* 3.6  CL 108 106 107 106 110  CO2 19* 23 26 24 24   GLUCOSE 101* 160* 133* 75 136*  BUN 12 18 21* 23* 20  CREATININE 0.49* 0.65 0.42* 0.65 0.56*  CALCIUM 7.6* 7.8* 7.3* 7.2* 7.3*  MG  --  1.9  --   --  2.6*   Liver Function Tests: Recent Labs  Lab 04/24/19 0600 04/26/19 0500 04/27/19 0618 04/28/19 0426  AST 96* 23 22 19   ALT 133* 64* 46* 36  ALKPHOS 87 92 90 85  BILITOT 0.8 0.4 0.6 0.5  PROT 5.1* 5.2* 4.9* 5.2*  ALBUMIN 2.5* 2.5* 2.5* 2.5*   No results for input(s): LIPASE, AMYLASE in the last 168 hours. No results for input(s): AMMONIA in the last 168 hours. CBC: Recent Labs  Lab 04/27/19 0618 04/28/19 0426 04/29/19 0400  WBC 6.4 4.6 6.6  NEUTROABS 2.3  --   --   HGB 7.5* 7.7* 12.2*  HCT 22.4* 22.7* 35.9*  MCV 87.2 88.7 88.9  PLT 130* 157 175   Cardiac Enzymes: No results for input(s): CKTOTAL, CKMB, CKMBINDEX, TROPONINI in the last 168 hours. BNP: Invalid input(s): POCBNP CBG: Recent Labs  Lab 04/29/19 1106 04/29/19 1615 04/29/19 1947 04/29/19 2340 04/30/19 0410  GLUCAP 97 89 102* 105* 94   D-Dimer No results for input(s): DDIMER in the last 72 hours. Hgb A1c No results for input(s): HGBA1C in the last 72 hours. Lipid Profile No results for input(s): CHOL, HDL, LDLCALC, TRIG, CHOLHDL, LDLDIRECT in the last 72 hours. Thyroid function studies No results for input(s): TSH, T4TOTAL, T3FREE, THYROIDAB in the last 72  hours.  Invalid input(s): FREET3 Anemia work up No results for input(s): VITAMINB12, FOLATE, FERRITIN, TIBC, IRON, RETICCTPCT in the last 72 hours. Urinalysis No results found for: COLORURINE, APPEARANCEUR, LABSPEC, PHURINE, GLUCOSEU, HGBUR, BILIRUBINUR, KETONESUR, PROTEINUR, UROBILINOGEN, NITRITE, LEUKOCYTESUR Sepsis Labs Invalid input(s): PROCALCITONIN,  WBC,  LACTICIDVEN Microbiology Recent Results (from the past 240 hour(s))  MRSA PCR Screening     Status: Abnormal   Collection Time: 04/22/19  6:00 PM   Specimen: Nasal Mucosa; Nasopharyngeal  Result Value Ref Range Status   MRSA by PCR POSITIVE (A) NEGATIVE Final    Comment:        The GeneXpert MRSA Assay (FDA approved for NASAL specimens only),  is one component of a comprehensive MRSA colonization surveillance program. It is not intended to diagnose MRSA infection nor to guide or monitor treatment for MRSA infections. CRITICAL RESULT CALLED TO, READ BACK BY AND VERIFIED WITH: RN RBV HOWARD AT 1121 04/23/19 CRUICKSHANK A Performed at [facility], [address] [place], [place] [address]      Time coordinating discharge: Over 40 minutes  SIGNED:   Marinda Elk, MD  [facility] 04/30/2019, 7:13 AM Pager   If 7PM-7AM, please contact night-coverage [email or web address] Password [email or web address]

## 2019-04-30 NOTE — Care Management Important Message (Signed)
Important Message  Patient Details  Name: Rorik Vespa MRN: [id number] Date of Birth: 1990-09-02   Medicare Important Message Given:  Yes - Important Message mailed due to current National Emergency  Verbal consent obtained due to current National Emergency  Relationship to patient: Father Contact Name: Jahree Dermody Call Date: 04/30/19  Time: 1401 Phone: [phone] Outcome: Spoke with contact Important Message mailed to: Patient address on file    Orson Aloe 04/30/2019, 2:01 PM

## 2020-04-02 DEATH — deceased

## 2021-03-15 IMAGING — DX DG CHEST 1V PORT
1 series · 1 of 1 positions shown · non-contrast
Comparison: 04/22/2019

CLINICAL DATA: Respiratory failure.

EXAM:
PORTABLE CHEST 1 VIEW

[chest ap]
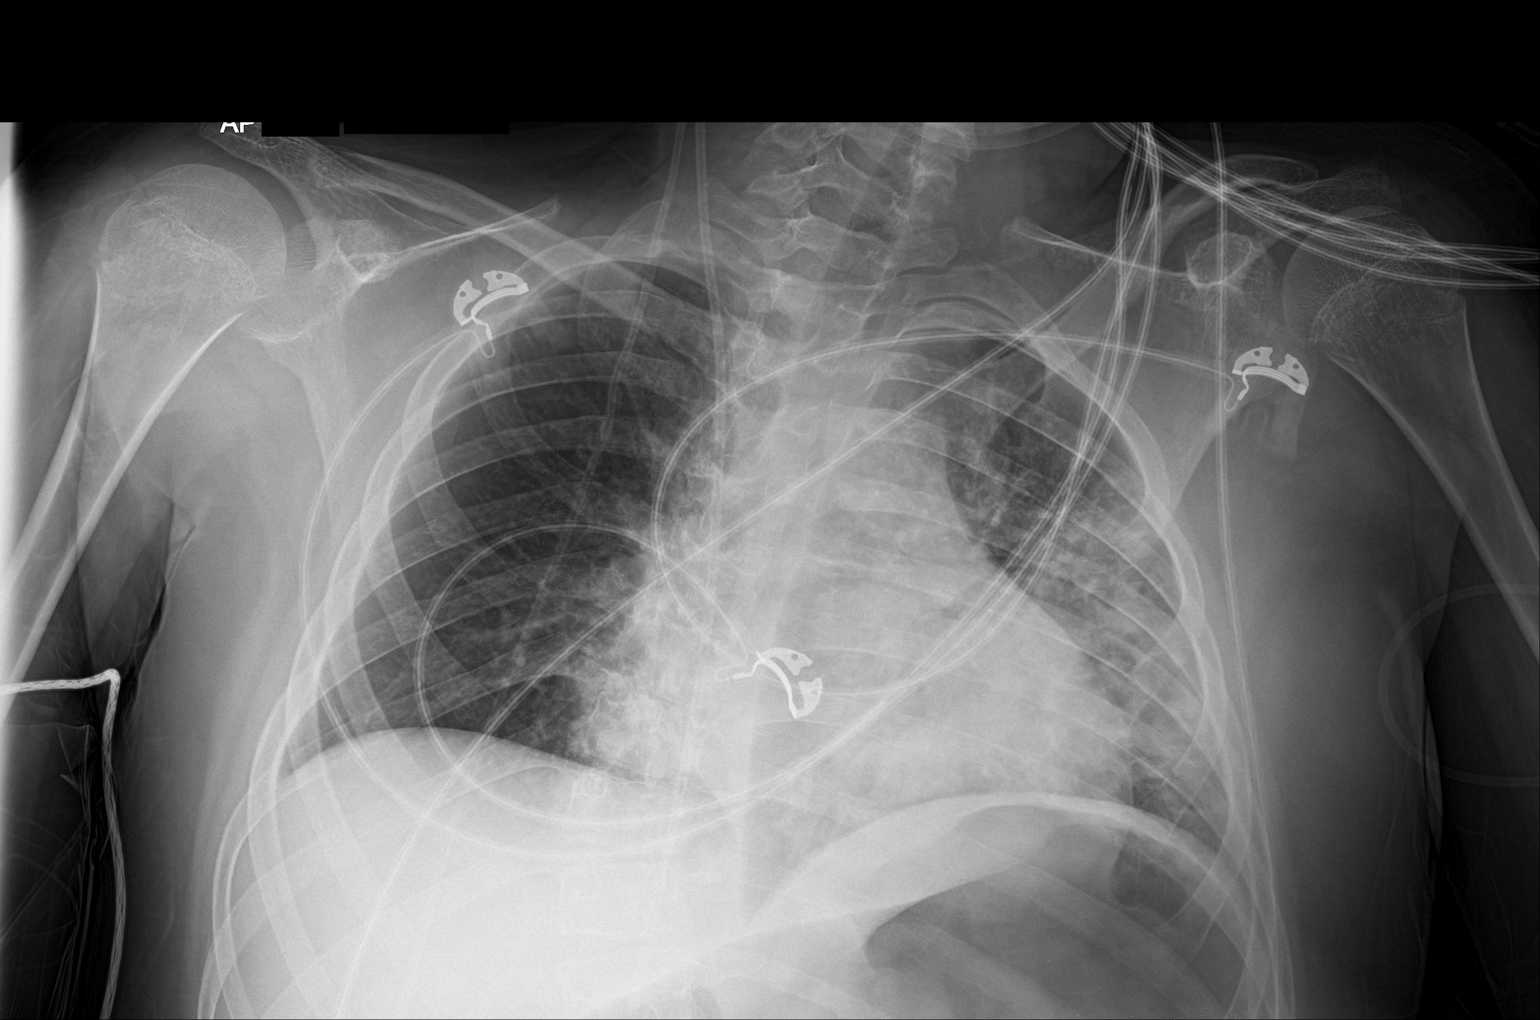

[1 of 1 positions shown; findings below may reference images not displayed]

FINDINGS: Patient is slightly rotated to the left. Lungs are hypoinflated with
patchy airspace process over the left lung likely pneumonia with
mild improved aeration in the left base. No effusion.
Cardiomediastinal silhouette and remainder of the exam is unchanged.
IMPRESSION: Patchy airspace process over the left lung likely multifocal
infection with mild improved aeration in the left base.

## 2021-03-20 IMAGING — CT CT HEAD WO/W CM
1 of 4 series · 15 of 30 positions shown, 19 images · IV contrast (omnipaque)
Comparison: CT head 02/03/2019

CLINICAL DATA: Headache.  Rule out hemorrhage.  VP shunt.

EXAM:
CT HEAD WITHOUT AND WITH CONTRAST
TECHNIQUE: Contiguous axial images were obtained from the base of the skull
through the vertex without and with intravenous contrast
CONTRAST:  75mL OMNIPAQUE IOHEXOL 300 MG/ML  SOLN

[Series 8: routine head w/o · axial · non-contrast · 0.46mm/px · z∈[-52,+116]mm · 15 of 160 slices shown, 19 images]
[im 10/160  brain]
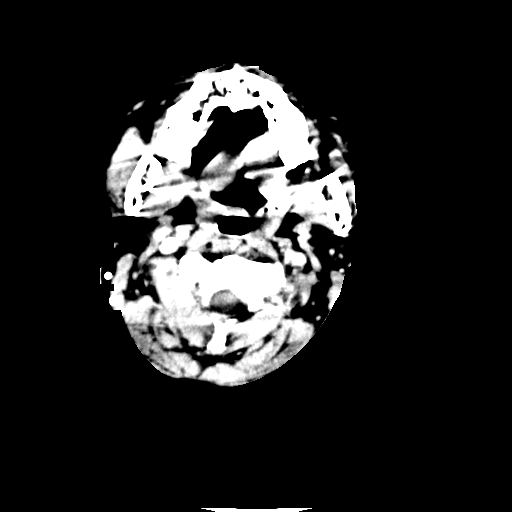
[im 10/160  bone]
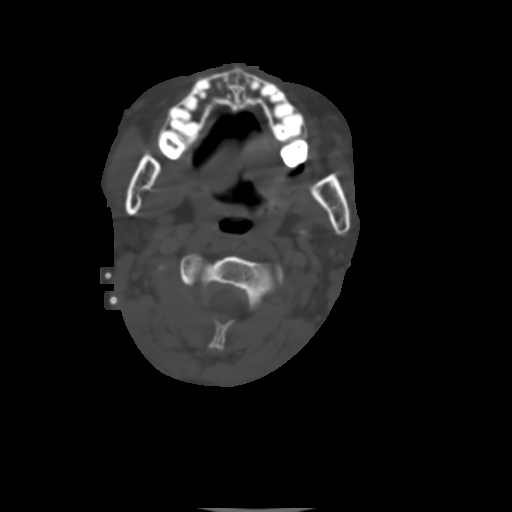
[im 19/160  brain]
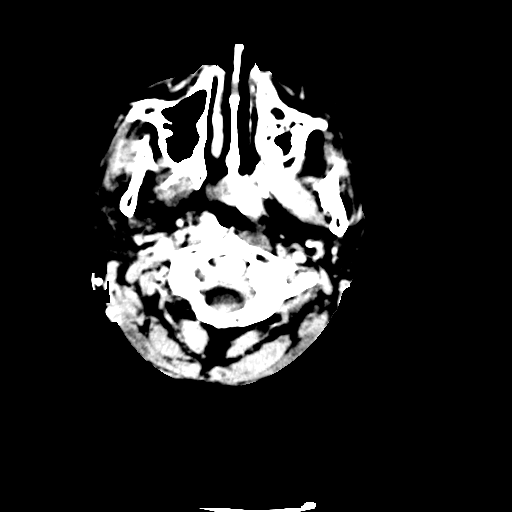
[im 29/160  brain]
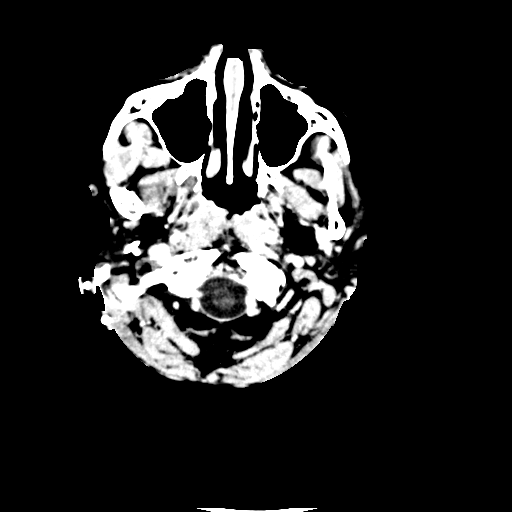
[im 38/160  brain]
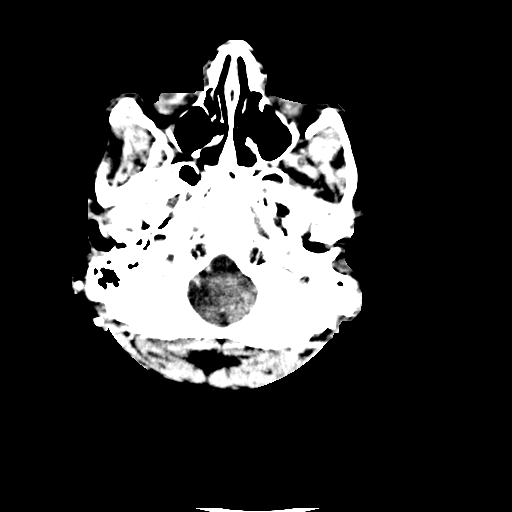
[im 47/160  brain]
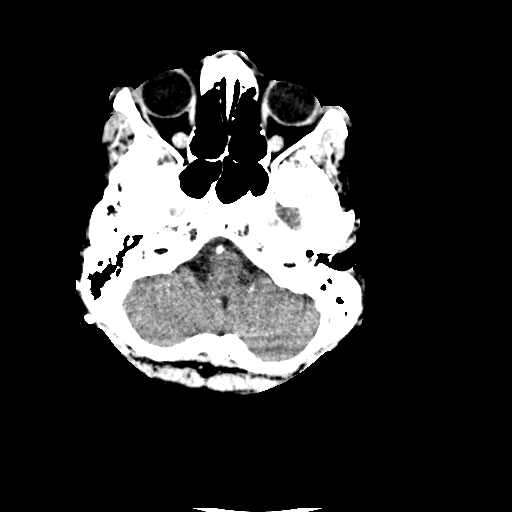
[im 47/160  bone]
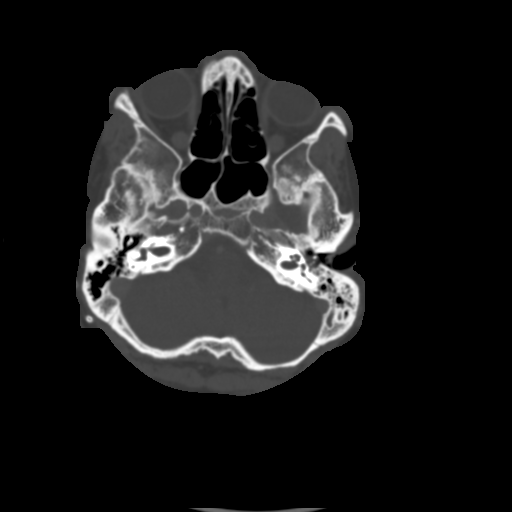
[im 57/160  brain]
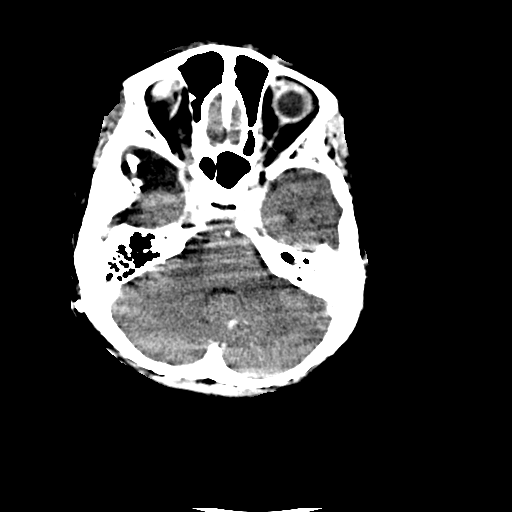
[im 66/160  brain]
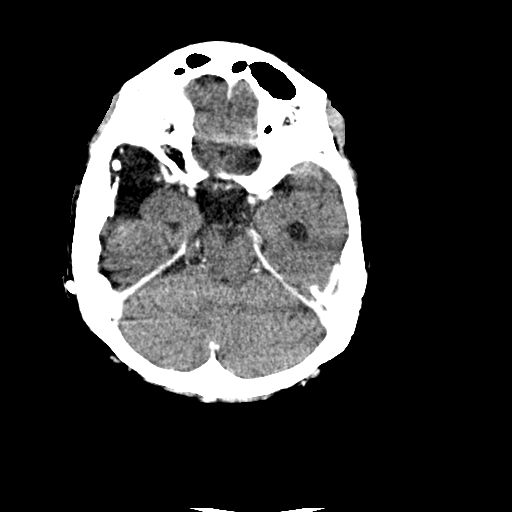
[im 85/160  brain]
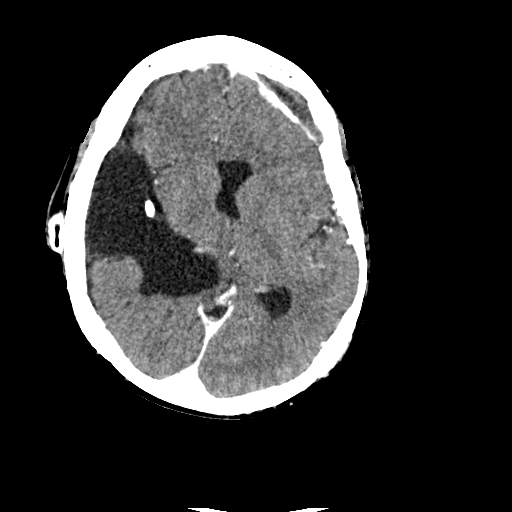
[im 94/160  brain]
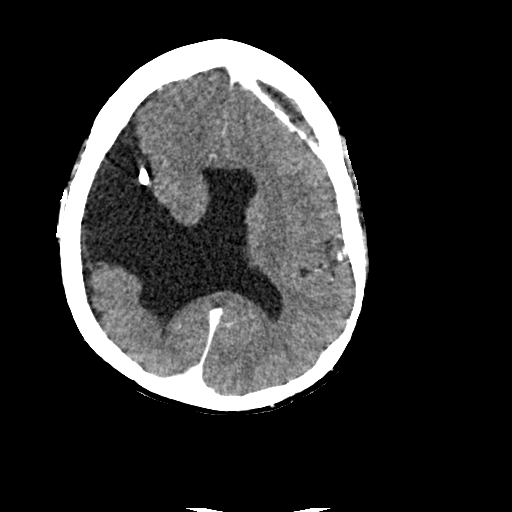
[im 94/160  bone]
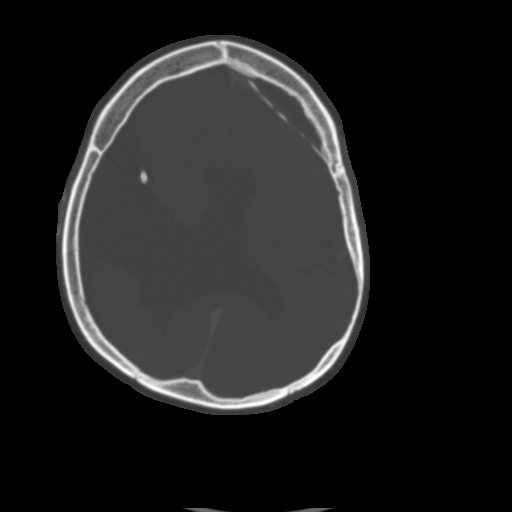
[im 103/160  brain]
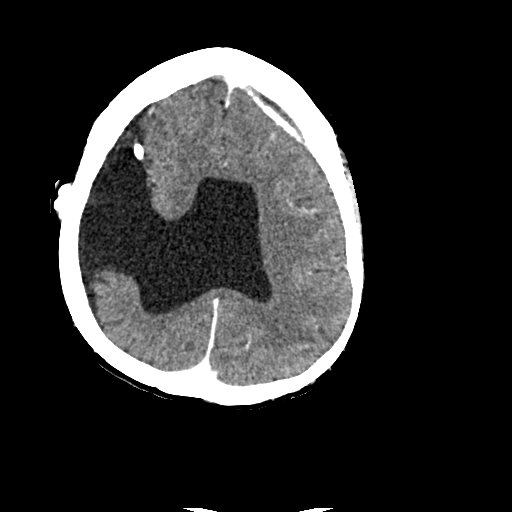
[im 113/160  brain]
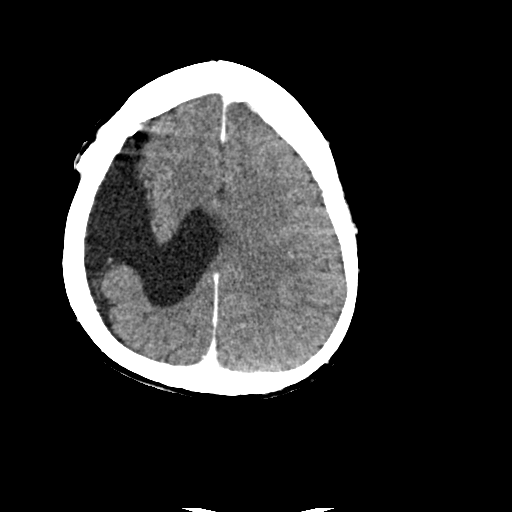
[im 122/160  brain]
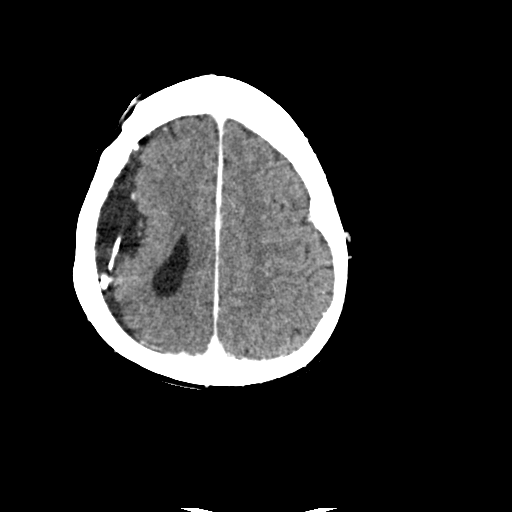
[im 131/160  brain]
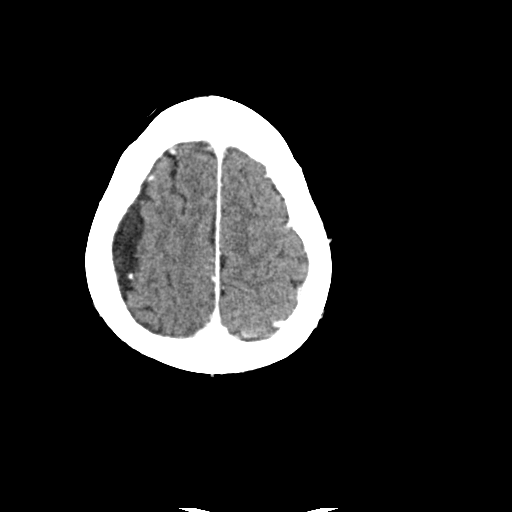
[im 131/160  bone]
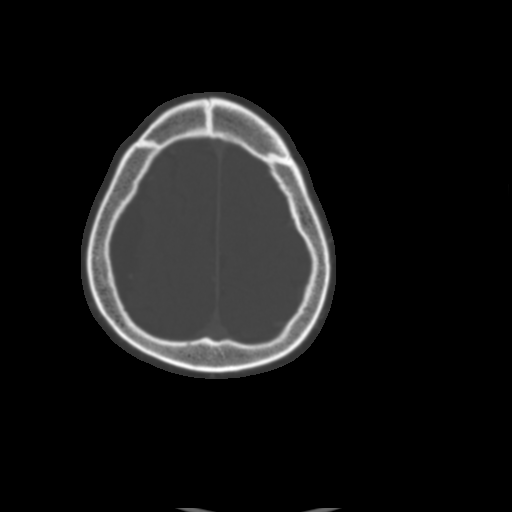
[im 141/160  brain]
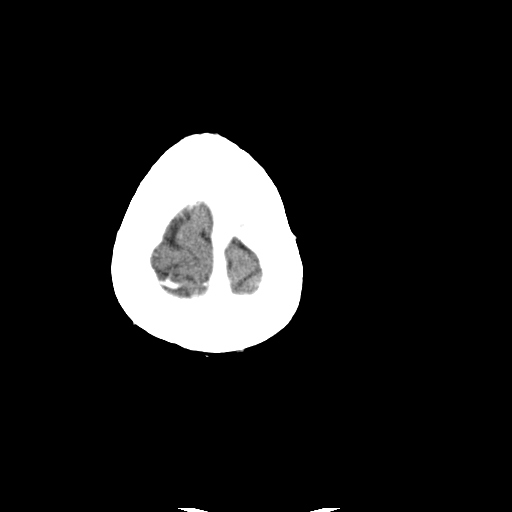
[im 150/160  brain]
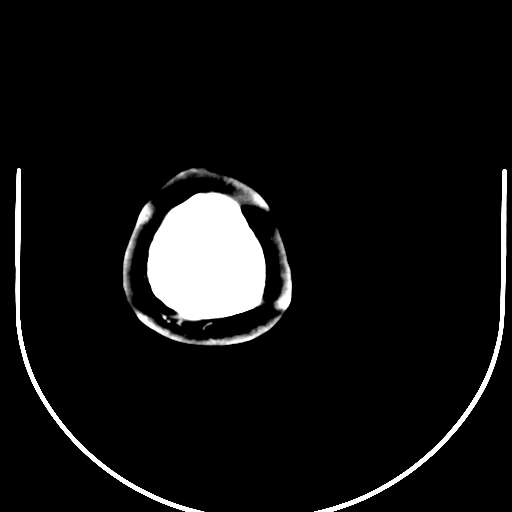

[15 of 30 positions shown; findings below may reference images not displayed]

FINDINGS: Brain: Negative for acute hemorrhage.

Left frontal low-density subdural fluid collection similar in
thickness approximately 9 mm. Progressive dural calcification since
the prior study. Chronic dural calcification in the right temporal
lobe is unchanged.

Large cleft in the right cerebral hemisphere compatible with
schizencephaly. Shunt catheter is present in the cleft, unchanged in
position. Absence of the septum pellucidum. Ventricular size mildly
prominent but stable from the prior study. Mild prominence of the
temporal horns stable. Corpus callosum appears intact on sagittal
images.

No enhancing lesions postcontrast administration.

No acute infarct.

Vascular: Negative for hyperdense vessel. Developmental venous
anomaly left cerebellum. Developmental venous anomaly left frontal
lobe.

Skull: No acute skeletal abnormality.

Sinuses/Orbits: Paranasal sinuses clear.  No orbital lesion.

Other: None
IMPRESSION: Left frontal extra-axial subdural fluid collection is low-density
and chronic. Progressive dural calcification in this area compared
with the prior CT of 02/03/2019

No acute hemorrhage

Schizencephaly with VP shunt catheter. Mild ventricular prominence,
stable from the prior study.
